# Patient Record
Sex: Female | Born: 1965 | Race: White | Hispanic: No | Marital: Single | Smoking: Former smoker
Health system: Southern US, Community
[De-identification: ages and names within clinical notes are randomized; demographics above are authoritative.]

## PROBLEM LIST (undated history)

## (undated) DIAGNOSIS — G894 Chronic pain syndrome: Secondary | ICD-10-CM

## (undated) DIAGNOSIS — M810 Age-related osteoporosis without current pathological fracture: Secondary | ICD-10-CM

## (undated) DIAGNOSIS — S069XAA Unspecified intracranial injury with loss of consciousness status unknown, initial encounter: Secondary | ICD-10-CM

## (undated) DIAGNOSIS — S065X9A Traumatic subdural hemorrhage with loss of consciousness of unspecified duration, initial encounter: Secondary | ICD-10-CM

## (undated) DIAGNOSIS — R519 Headache, unspecified: Secondary | ICD-10-CM

## (undated) DIAGNOSIS — H269 Unspecified cataract: Secondary | ICD-10-CM

## (undated) DIAGNOSIS — M1990 Unspecified osteoarthritis, unspecified site: Secondary | ICD-10-CM

## (undated) DIAGNOSIS — S069X9A Unspecified intracranial injury with loss of consciousness of unspecified duration, initial encounter: Secondary | ICD-10-CM

## (undated) DIAGNOSIS — G43909 Migraine, unspecified, not intractable, without status migrainosus: Secondary | ICD-10-CM

## (undated) DIAGNOSIS — F419 Anxiety disorder, unspecified: Secondary | ICD-10-CM

## (undated) DIAGNOSIS — F32A Depression, unspecified: Secondary | ICD-10-CM

## (undated) DIAGNOSIS — J449 Chronic obstructive pulmonary disease, unspecified: Secondary | ICD-10-CM

## (undated) DIAGNOSIS — D649 Anemia, unspecified: Secondary | ICD-10-CM

## (undated) DIAGNOSIS — K219 Gastro-esophageal reflux disease without esophagitis: Secondary | ICD-10-CM

## (undated) DIAGNOSIS — R51 Headache: Secondary | ICD-10-CM

## (undated) DIAGNOSIS — I10 Essential (primary) hypertension: Secondary | ICD-10-CM

## (undated) DIAGNOSIS — J309 Allergic rhinitis, unspecified: Secondary | ICD-10-CM

## (undated) DIAGNOSIS — F329 Major depressive disorder, single episode, unspecified: Secondary | ICD-10-CM

## (undated) DIAGNOSIS — S065XAA Traumatic subdural hemorrhage with loss of consciousness status unknown, initial encounter: Secondary | ICD-10-CM

## (undated) HISTORY — DX: Migraine, unspecified, not intractable, without status migrainosus: G43.909

## (undated) HISTORY — DX: Unspecified cataract: H26.9

## (undated) HISTORY — DX: Allergic rhinitis, unspecified: J30.9

## (undated) HISTORY — PX: CATARACT EXTRACTION: SUR2

## (undated) HISTORY — DX: Age-related osteoporosis without current pathological fracture: M81.0

## (undated) HISTORY — DX: Traumatic subdural hemorrhage with loss of consciousness status unknown, initial encounter: S06.5XAA

## (undated) HISTORY — DX: Traumatic subdural hemorrhage with loss of consciousness of unspecified duration, initial encounter: S06.5X9A

## (undated) HISTORY — DX: Chronic pain syndrome: G89.4

## (undated) HISTORY — DX: Unspecified intracranial injury with loss of consciousness status unknown, initial encounter: S06.9XAA

## (undated) HISTORY — DX: Anemia, unspecified: D64.9

## (undated) HISTORY — PX: BREAST ENHANCEMENT SURGERY: SHX7

## (undated) HISTORY — DX: Unspecified intracranial injury with loss of consciousness of unspecified duration, initial encounter: S06.9X9A

## (undated) HISTORY — DX: Chronic obstructive pulmonary disease, unspecified: J44.9

## (undated) HISTORY — PX: BRAIN SURGERY: SHX531

---

## 2006-06-20 ENCOUNTER — Ambulatory Visit: Payer: Self-pay | Admitting: Psychiatry

## 2006-06-21 ENCOUNTER — Inpatient Hospital Stay (HOSPITAL_COMMUNITY): Admission: AD | Admit: 2006-06-21 | Discharge: 2006-06-26 | Payer: Self-pay | Admitting: Psychiatry

## 2006-06-26 ENCOUNTER — Other Ambulatory Visit (HOSPITAL_COMMUNITY): Admission: RE | Admit: 2006-06-26 | Discharge: 2006-09-24 | Payer: Self-pay | Admitting: Psychiatry

## 2006-07-22 ENCOUNTER — Inpatient Hospital Stay (HOSPITAL_COMMUNITY): Admission: EM | Admit: 2006-07-22 | Discharge: 2006-07-24 | Payer: Self-pay | Admitting: Psychiatry

## 2007-01-09 ENCOUNTER — Inpatient Hospital Stay (HOSPITAL_COMMUNITY): Admission: AD | Admit: 2007-01-09 | Discharge: 2007-01-13 | Payer: Self-pay | Admitting: *Deleted

## 2007-01-09 ENCOUNTER — Ambulatory Visit: Payer: Self-pay | Admitting: *Deleted

## 2007-04-03 ENCOUNTER — Ambulatory Visit: Payer: Self-pay | Admitting: Psychiatry

## 2007-04-03 ENCOUNTER — Inpatient Hospital Stay (HOSPITAL_COMMUNITY): Admission: AD | Admit: 2007-04-03 | Discharge: 2007-04-06 | Payer: Self-pay | Admitting: Psychiatry

## 2007-06-20 ENCOUNTER — Ambulatory Visit: Payer: Self-pay | Admitting: Psychiatry

## 2007-06-21 ENCOUNTER — Inpatient Hospital Stay (HOSPITAL_COMMUNITY): Admission: RE | Admit: 2007-06-21 | Discharge: 2007-06-23 | Payer: Self-pay | Admitting: Psychiatry

## 2007-08-08 ENCOUNTER — Inpatient Hospital Stay (HOSPITAL_COMMUNITY): Admission: RE | Admit: 2007-08-08 | Discharge: 2007-08-12 | Payer: Self-pay | Admitting: *Deleted

## 2007-08-08 ENCOUNTER — Ambulatory Visit: Payer: Self-pay | Admitting: *Deleted

## 2007-09-04 ENCOUNTER — Ambulatory Visit: Payer: Self-pay | Admitting: *Deleted

## 2007-09-04 ENCOUNTER — Inpatient Hospital Stay (HOSPITAL_COMMUNITY): Admission: EM | Admit: 2007-09-04 | Discharge: 2007-09-05 | Payer: Self-pay | Admitting: *Deleted

## 2008-05-26 ENCOUNTER — Ambulatory Visit: Payer: Self-pay | Admitting: Psychiatry

## 2008-05-26 ENCOUNTER — Inpatient Hospital Stay (HOSPITAL_COMMUNITY): Admission: RE | Admit: 2008-05-26 | Discharge: 2008-06-02 | Payer: Self-pay | Admitting: Psychiatry

## 2008-05-26 ENCOUNTER — Emergency Department (HOSPITAL_COMMUNITY): Admission: EM | Admit: 2008-05-26 | Discharge: 2008-05-26 | Payer: Self-pay | Admitting: Emergency Medicine

## 2010-08-14 LAB — CBC
HCT: 36.2 % (ref 36.0–46.0)
Platelets: 132 10*3/uL — ABNORMAL LOW (ref 150–400)
RDW: 13.5 % (ref 11.5–15.5)
WBC: 5.4 10*3/uL (ref 4.0–10.5)

## 2010-08-14 LAB — DIFFERENTIAL
Basophils Relative: 0 % (ref 0–1)
Eosinophils Relative: 2 % (ref 0–5)
Lymphocytes Relative: 21 % (ref 12–46)
Monocytes Absolute: 0.5 10*3/uL (ref 0.1–1.0)
Monocytes Relative: 10 % (ref 3–12)
Neutro Abs: 3.5 10*3/uL (ref 1.7–7.7)

## 2010-08-14 LAB — COMPREHENSIVE METABOLIC PANEL
AST: 40 U/L — ABNORMAL HIGH (ref 0–37)
Albumin: 3.6 g/dL (ref 3.5–5.2)
Alkaline Phosphatase: 115 U/L (ref 39–117)
BUN: 3 mg/dL — ABNORMAL LOW (ref 6–23)
Chloride: 105 mEq/L (ref 96–112)
GFR calc Af Amer: >60 mL/min (ref >60)
Potassium: 3.1 mEq/L — ABNORMAL LOW (ref 3.5–5.1)
Total Protein: 6.8 g/dL (ref 6.0–8.3)

## 2010-08-14 LAB — RAPID URINE DRUG SCREEN, HOSP PERFORMED
Amphetamines: NOT DETECTED
Benzodiazepines: POSITIVE — AB
Cocaine: NOT DETECTED
Tetrahydrocannabinol: NOT DETECTED

## 2010-08-14 LAB — URINALYSIS, ROUTINE W REFLEX MICROSCOPIC
Bilirubin Urine: NEGATIVE
Ketones, ur: 15 mg/dL — AB
Nitrite: NEGATIVE
Protein, ur: NEGATIVE mg/dL
Urobilinogen, UA: 1 mg/dL (ref 0.0–1.0)
pH: 7.5 (ref 5.0–8.0)

## 2010-08-14 LAB — ETHANOL: Alcohol, Ethyl (B): <5 mg/dL (ref 0–10)

## 2010-08-14 LAB — URINE MICROSCOPIC-ADD ON
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
## 2010-09-12 NOTE — H&P (Signed)
[name], [name]             ACCOUNT NO.:  [id number]   MEDICAL RECORD NO.:  [id number]          PATIENT TYPE:  IPS   LOCATION:  0305                          FACILITY:  [facility]   PHYSICIAN:  Jasmine Pang, M.D. DATE OF BIRTH:  01/06/1966   DATE OF ADMISSION:  01/09/2007  DATE OF DISCHARGE:                       PSYCHIATRIC ADMISSION ASSESSMENT   IDENTIFYING INFORMATION:  This is a 45 year old white female.  This is a  voluntary admission.   HISTORY OF PRESENT ILLNESS:  Patient presented in the emergency room at  [facility] with an alcohol level of 0.41.  This was her third visit  to the emergency room within the week for alcohol intoxication.  She is  currently followed at [facility] and had not been taking  her medications at home.  She, herself, this time had called emergency  services because she had fallen down the steps and knew that she needed  help.  Requesting detox from alcohol.  Reports that she relapsed about  two months ago on alcohol that began with drinking one beer now and then  throughout the week and, at the same time, began taking some Librium  that she had in a previous prescription for a detox protocol.  Drinking  escalated rapidly and she is unable to tell us exactly how much she has  been drinking for the past several days.  She has some thought blocking,  psychomotor slowing, some derailing today of thought and somewhat labile  affect, becoming frequently tearful and she is a poor historian due to  impaired cognition.  She denies any suicidal thoughts today.  Denies any  homicidal thought.  Was involuntarily petitioned due to her level of  intoxication.   PAST PSYCHIATRIC HISTORY:  Third admission to [facility] this year.  First admission was from of June 21, 2006  to June 26, 2006 for major depressive disorder and detox from  alcohol and most recent admission July 22, 2006 to July 24, 2006, also  for alcohol abuse and dependence.  Currently followed as an outpatient  by [facility].  No history of prior suicide attempts.  Longest history of sobriety being 130 days.  She had been referred to a  28-day program in the past but did not stay.  Has been treated in the  past with Risperdal, Lexapro and Campral but had not taken anything  regularly.   SOCIAL HISTORY:  Widowed four years ago.  Has current relationship for  the past 1-1/2 years with a gentleman who also uses alcohol.  The  patient reports unplanned pregnancy recently has been an additional  stressor and she had a therapeutic abortion approximately two weeks ago.  She works full-time for [employer] out of her home.  Has one son in  college at [employer] at [employer] on full  scholarship.  She denies any current legal problems.   MEDICAL HISTORY:  Primary care [name] is unclear.  Medical problems  have been elevated blood pressure for which she has taken lisinopril,  anywhere between 10 mg and 20  mg, currently on 10 mg daily.   ALLERGIES:  PENICILLIN and SULFA.   POSITIVE PHYSICAL FINDINGS:  Full physical exam was done in the  emergency room.  This is a compact-built female with flushed face and  psychomotor slowing and blunted affect.  Height 5 feet 2 inches tall,  weight 105 pounds, temperature 98.6, pulse 99, respirations 18, blood  pressure 151/99.  See full physical exam done in the emergency room at  [facility].   LABORATORY DATA:  CBC revealed WBC 5.0, hemoglobin 12.5, hematocrit  35.7, platelets 146,000 and MCV 97.  Chemistry revealed sodium 132,  potassium 4.1, chloride 95, carbon dioxide 21, BUN 4, creatinine 0.43  and random glucose is 98.  Liver enzymes revealed SGOT 61, SGPT 30,  alkaline phosphatase 74 and total bilirubin of 0.4.  Her urine drug  screen was positive for benzodiazepines and routine urinalysis  remarkable for trace glucose.   MENTAL STATUS  EXAM:  Fully alert female with a dazed affect.  Psychomotor slowing.  Responses are lag.  Has a perplexed look on her  face.  Attempting to cooperate with history.  History is forthcoming.  Some thought blocking and derailing.  No asterixis or frank tremor.  Mood is depressed and irritable.  She is primarily concerned at this  point about keeping her job and is worried that her company will  terminate her for missing work.  Having difficulty remembering when she  worked last and what her benefit situation is at this point.  She is  denying suicidal or homicidal thoughts today.  Denies depression, having  difficulty recalling medication dosages and when she last took them.  Unable to remember exactly how much she has been drinking.  Cognition is  impaired for recent and immediate memory.  Losing her train of thought  mid-sentence.  Judgment is impaired.  Calculation and concentration are  impaired.  Insight is impaired.  She is upset about the fact that she  has been involuntarily petitioned and has asked for an explanation of  this about three different times during the interview.  However, she has  been cooperative and directable.   DIAGNOSES:  AXIS I:  Alcohol abuse and dependence; rule out substance-  induced mood disorder.  AXIS II:  Deferred.  AXIS III:  Elevated blood pressure, rule out hypertension.  AXIS IV:  Severe (issues with social functioning and missing work).  AXIS V:  Current GAF 30; past year 70+.   PLAN:  To involuntarily admit the patient with a goal of a safe detox.  We have started her on a Librium protocol and gave her 100 mg loading  dose last night.  Will continue day #1 on the protocol today, force  fluids and monitor her vital signs closely.  We will also give her  trazodone 50 mg h.s. p.r.n. insomnia and will continue her lisinopril at  10 mg daily.  She is currently attending dual diagnosis programming.   ESTIMATED LENGTH OF STAY:  Five days.       Margaret A. Lorin Picket, N.P.      Jasmine Pang, M.D.  Electronically Signed    MAS/MEDQ  D:  01/10/2007  T:  01/10/2007  Job:  [phone]
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
## 2010-09-15 NOTE — Discharge Summary (Signed)
NAMEROSANN, GORUM             ACCOUNT NO.:  [id number]   MEDICAL RECORD NO.:  [id number]          PATIENT TYPE:  IPS   LOCATION:  0503                          FACILITY:  [facility]   PHYSICIAN:  Anselm Jungling, MD  DATE OF BIRTH:  [birth year]   DATE OF ADMISSION:  04/03/2007  DATE OF DISCHARGE:  04/06/2007                               DISCHARGE SUMMARY   IDENTIFYING DATA AND REASON FOR ADMISSION:  This was an inpatient  psychiatric admission for Cierra, a 45 year old married white female  admitted due to alcohol relapse, depression, and suicidal ideation,  within the context of severe marital discord.  Please refer to the  admission note for further details pertaining to the symptoms,  circumstances and history that led to her hospitalization.  She was  given an initial Axis I diagnoses of alcohol dependence, and alcohol  withdrawal.   MEDICAL AND LABORATORY:  The patient was admitted to the inpatient  psychiatric service, and was assessed by the psychiatric nurse  practitioner.  Presenting medical problems included hypertension, and  current medications included naltrexone and lisinopril.  There were no  acute medical issues during this brief inpatient stay.   HOSPITAL COURSE:  The patient was admitted to the adult inpatient  service.  She presented as a well-nourished, well-developed woman who  was alert, fully oriented, requesting discharge.  There were no signs or  symptoms of psychosis or thought disorder.   Historical information we received indicated that the patient had a long  history of multiple hospitalizations and severe dysfunction due to her  chronic alcohol dependence, and inability to remain abstinent.   The patient was placed on a Librium withdrawal protocol and given  tapering doses of Librium over 3 days.  This ameliorated expression of  alcohol withdrawal symptoms that she would have had otherwise.  Throughout this process, she indicated her opinion that  she did not feel  it was necessary and expressed concern that being away from work would  affect her employment.   On the fourth hospital day, the patient appeared to have completed the  alcohol withdrawal process, and no longer appeared to be at risk for  medical sequelae of alcohol withdrawal.  According to her wishes, she  was discharged.  She agreed to the following aftercare plan.   AFTERCARE:  The patient was to follow-up at [facility] on  April 07, 2007.  She was also [facility]  with the intake appointment there on April 09, 2007.   DISCHARGE MEDICATIONS:  Prinivil 10 mg daily.   DISCHARGE DIAGNOSES:  AXIS I: Alcohol dependence, early remission.  AXIS II: Deferred.  AXIS III: No acute or chronic illnesses  AXIS IV: Stressors severe.  AXIS V: GAF on discharge 50.      Anselm Jungling, MD  Electronically Signed     SPB/MEDQ  D:  04/17/2007  T:  04/18/2007  Job:  [id number]
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
## 2010-09-15 NOTE — Discharge Summary (Signed)
NAMECELISA, SCHOENBERG             ACCOUNT NO.:  [id number]   MEDICAL RECORD NO.:  [id number]          PATIENT TYPE:  IPS   LOCATION:  0305                          FACILITY:  BH   PHYSICIAN:  Jasmine Pang, M.D. DATE OF BIRTH:  March 23, 1966   DATE OF ADMISSION:  01/09/2007  DATE OF DISCHARGE:  01/13/2007                               DISCHARGE SUMMARY   IDENTIFICATION:  This is a 45 year old white female who was admitted on  a voluntary basis.   HISTORY OF PRESENT ILLNESS:  The patient presented in the emergency room  at [facility] with an alcohol level of 0.41.  This was the third  visit to the emergency room within the week of alcohol intoxication.  She is currently followed at [facility] and has not  been taking her medications at home.  She herself at this time had  called emergency services because she had fallen down the steps and knew  she needed help.  She was requesting detox from alcohol.  She reports  that she relapsed about 2 months ago on alcohol and began drinking 1  beer now and then through the week.  At the same time, she began taking  Librium, which she had been given on a previous prescription for detox  protocol.  Her drinking escalated rapidly and she was unable to tell us  exactly how much she has been drinking for the past several days.  She  has some thought blocking, psychomotor slowing, and some impaired  cognition.  She denies any suicidal thoughts.  On the day of admission,  she denied any homicidal thoughts.  She was involuntarily petitioned due  to her level of intoxication.  This was the third admission to [facility] this year.  First admission was from  June 21, 2006, to June 26, 2006, for major depressive disorder  and detox from alcohol.  Most recent admission was July 22, 2006, to  July 24, 2006, for alcohol abuse and dependence.  She is currently  followed as an outpatient by [facility].  She has no  history of prior suicide attempts.  The longest history of sobriety  being 130 days.  She had been referred to a 28-day program in the past,  but did not stay.  She has been treated with Risperdal, Lexapro, and  Campral, but has not taken anything regularly.  The patient has been  widowed for 4 years.  Her current relationship for the past 1-1/2 years  is with a gentleman who also uses alcohol.  She reported an unplanned  pregnancy recently, which has been an additional stressor.  She had a  therapeutic abortion approximately 2 weeks prior to admission.  She  works full time for the [employer] out of her home.  She has 1 son  in college at [employer] [address], [place], on a full  scholarship.  She denies any current legal problems.  Her medical  problems have been hypertension for which she takes lisinopril anywhere  between 10 mg and 20 mg  daily, currently on 10 mg daily.  She is  allergic to PENICILLIN and SULFA drugs.   PHYSICAL FINDINGS:  Full physical exam was done in the emergency room.  This is a compact-built female with flushed face and psychomotor slowing  and blunted affect.  She had no acute physical or medical problems.   ADMISSION LABORATORY DATA:  CBC revealed a WBC of 5, hemoglobin of 12.5,  hematocrit of 35.7, platelets of 146,000, and MCV of 97.  Chemistry  revealed sodium of 132, potassium 4.1, chloride 95, carbon dioxide 21,  BUN 4, creatinine 43, and random glucose is 98.  Liver enzymes revealed  an SGOT is 61, SGPT of 30, alkaline phosphatase of 74, and total  bilirubin was 0.4.  Her urine drug screen was positive for  benzodiazepines and routine urinalysis remarkable for trace glucose.   HOSPITAL COURSE:  Upon admission, the patient was continued on her home  birth control pill 1 daily.  She was also started on Librium detox  protocol.  She was continued on her lisinopril 10 mg p.o. daily.  On  January 10, 2007, she was started on trazodone 50 mg p.o. q.h.s.  p.r.n. insomnia.  On January 11, 2007, the Librium protocol was  discontinued, instead she was placed on Ativan detox protocol (1 mg  t.i.d. on January 11, 2007, 1 mg b.i.d. on January 12, 2007, 1 mg  daily on January 13, 2007, and then discontinue).  On January 13, 2007, she was placed on nicotine 21 mg patch as per smoking cessation  protocol.  On January 13, 2007, she was placed on Vivitrol 380 mg IM  and the patient tolerated these medications well with no significant  side effects.  The patient was friendly and cooperative.  She was able  to participate appropriately in unit therapeutic groups and activities.  She stated that in addition to her son in college, she also has a 25-  year-old son who currently lives with a friend.  She stated she has  conflict with and lack of support from her second husband.  First  husband died 4 years ago.  For the past few days, she began to drink a  lot.  She stated she began to have DTs when she did not drink.  She  had also been depressed and alcohol was helpful with that.  As  hospitalization progressed, mental status improved.  On January 13, 2007, the patient's mood was euthymic.  Affect wide range.  There was no  suicidal or homicidal ideation.  No thoughts of self-injurious behavior.  No auditory or visual hallucinations.  No paranoia or delusions.  Thoughts were logical and goal directed.  Thought content no predominant  theme.  Cognitive was grossly back to baseline.  The patient was  considering fellowship home for followup.  She liked to be on the  Vivitrol because it was a once monthly injection.  The Campral dose that  she had been on was too complicated to remain compliant with.  She was  also talking about ADS as a possibility.  It was felt she was safe to be  discharged home.   DISCHARGE DIAGNOSES:  AXIS I:  Alcohol dependence.  Depressive disorder,  not otherwise  specified.  AXIS II:  None.  AXIS III:  Elevated blood pressure, rule out hypertension.  AXIS IV:  Severe (issues with social functioning and missing work).  AXIS V:  Global assessment of functioning upon discharge was 50.  Global  assessment of functioning upon admission was 30.  Global assessment of  functioning highest past year was 70 plus.   DISCHARGE PLANS:  There were no specific activity level or dietary  restrictions.   POSTHOSPITAL CARE PLANS:  The patient will go to alcohol and drug  services for followup.  She will see Cherlyn Cushing there.  She will also  start therapy with Harlon Ditty at the [facility]  on January 14, 2007.   DISCHARGE MEDICATIONS:  1. Lisinopril 10 mg daily.  2. ReVia 50 mg daily.  3. Vivitrol 380 mg IM given on January 13, 2007.  4. Trazodone 50 mg at bedtime if needed for sleep.      Jasmine Pang, M.D.  Electronically Signed     BHS/MEDQ  D:  02/04/2007  T:  02/04/2007  Job:  [id number]
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
## 2010-09-15 NOTE — H&P (Signed)
[name], [name]             ACCOUNT NO.:  [id number]   MEDICAL RECORD NO.:  [id number]          PATIENT TYPE:  IPS   LOCATION:  0503                          FACILITY:  BH   PHYSICIAN:  Anselm Jungling, MD  DATE OF BIRTH:  [birth year]   DATE OF ADMISSION:  04/03/2007  DATE OF DISCHARGE:  04/06/2007                       PSYCHIATRIC ADMISSION ASSESSMENT   IDENTIFICATION:  45 year old separated white female.  This is an  involuntary admission.   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  Alcohol level 380 on  presentation and [name] had come accompanied by a friend who brought her  because [name] had told the friend that [name] wanted to die.  [name] reports  that [name] has been binge drinking for about 3 weeks after her parents  told her that [name] could not come to the Thanksgiving Day family dinner  and see her son in spite of the fact that [name] had achieved 70 days of  abstinence after discharge from the [facility] treatment center.  [name] is  denying any suicidal thoughts today and denies that [name] ever made such a  statement and no homicidal thought.  [name] is primarily concerned today  about getting back to work and is concerned that [name] may have lost her  job after feeling to appear for work on Thursday December 4.   PAST PSYCHIATRIC HISTORY:  The [facility] [place], [place], and also by Sena Hitch at the [employer].  This is one of several  admissions to [facility], last admission  September 11 to the 15th 2008 and previous admissions March 24-26,  2008, and on February 27 for 2 days in 2008, and  February 22-28, 2008.  Recently [name] was at the [facility] treatment center and had 70 days of  abstinence.  Previously on Vivitrol injection prior to her last  discharge and has been taking naltrexone orally as her only medication.   SOCIAL HISTORY:  The patient is currently living alone, separated from  her husband, has a 10 year old son in college and a 40 year old son  living with grandparents.  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  Has  had some issues with a job being at risk due to frequent absences, but  at this point is currently employed and denying any legal problems.   MEDICAL HISTORY:  Current medical problems are hypertension and current  medications are naltrexone 50 mg daily and lisinopril 10 mg daily.  Also  [name] routinely takes an oral contraceptive tablet which [name] has brought  with her and multivitamin and a B1 tablet daily.  Please note the  patient was prescribed Ativan 0.5 mg in a tapering fashion by the  emergency room  and has also been treated with phenobarbital in the last  couple of days.   DRUG ALLERGIES:  PENICILLIN, BACTRIM.   POSITIVE PHYSICAL FINDINGS:  A 45 year old petite white female with  flushed face, slight tremor.  Skin is moist and [name] appears to be in  full detox. Her full physical exam was done in the emergency room at  [facility] and is noted in the record.  [name] is 5 feet 1 inch  tall, 114 pounds.  Temperature 100.3.  Medical history is remarkable for  alcohol abuse and dependence.  Pulse 131, respirations 18, blood  pressure 147/81 and O2 saturation 96%.  A full physical exam done in the  [facility] emergency room and please note that there are additional  notations from the emergency room from her previous visits over the  preceding 2 weeks.   DIAGNOSTIC STUDIES:  Her phenobarbital level was 17.1 which was checked  due to having received previous doses  of phenobarbital, and urine drug  screen positive for barbiturates.  Ethanol level was 0.38 and chemistry,  sodium 132, potassium 4.6, chloride 97, carbon dioxide 23, BUN 6,  creatinine 0.49 and random glucose 91.  Liver enzymes SGOT 64, SGPT 18,  alkaline phosphatase 253 and total bilirubin 0.3.  Routine urinalysis  was unremarkable and CBC WBC 4.3, hemoglobin 13.4, hematocrit 38.5,  platelets 182,000 and MCV 99.  Acetaminophen less than 10.  Salicylate  level less than 1   MENTAL STATUS EXAM:  Fully alert female, anxious affect, flushed,  appears to be in full detox and CIWA estimated about 12, having fine  constant tremor.  Speech is normal in production pace, tone but is  slightly tremulous voice tone.  Mood is quite anxious.  Denying any  suicidal or homicidal thought, logical, coherent, goal directed  thinking, wanting to leave, mostly expressing a lot of concern about her  job.  Cognition is intact to person, place and situation.  Immediate,  recent, remote memory are intact.  Judgment questionable.  Impulse  control guarded.   AXIS I:  Rule out substance-induced mood disorder.  Alcohol abuse and  dependence.  AXIS II:  Deferred  AXIS III:  Hypertension by history.  AXIS IV:  Severe, issues with social support system and problems with  social environment and functioning  AXIS V:  Current 39 past year 14 estimated.   PLAN:  Is to involuntarily admit the patient.  We have placed her on a  Librium detox in our dual diagnosis program and [name] is tolerating it  well without difficulty.  We have explained how to access additional  Librium for her symptoms and we are checking a CIWA score on her for  withdrawal every 6 hours.  We are going to restart her naltrexone 25 mg  daily for the first week then 50 mg daily and have restarted her  lisinopril 10 mg daily.   Estimated length of stay is 3 days      Margaret A. Lorin Picket, N.P.      Anselm Jungling, MD  Electronically  Signed    MAS/MEDQ  D:  04/07/2007  T:  04/07/2007  Job:  [phone]

## 2010-09-15 NOTE — Discharge Summary (Signed)
[name], [name]             ACCOUNT NO.:  [id number]   MEDICAL RECORD NO.:  [id number]          PATIENT TYPE:  IPS   LOCATION:  0301                          FACILITY:  BH   PHYSICIAN:  Anselm Jungling, MD  DATE OF BIRTH:  [birth year]   DATE OF ADMISSION:  06/21/2006  DATE OF DISCHARGE:  06/26/2006                               DISCHARGE SUMMARY   IDENTIFYING DATA AND REASON FOR ADMISSION:  The patient is a 45 year old  separated female with a history of alcoholism who relapsed on alcohol 2  months prior to admission.  On the evening of June 19, 2006, she had  an alcoholic binge, and following this, a friend took her to get  evaluation.  While intoxicated, she made suicidal statements which she  subsequently denied.  She had been seeing a gentleman named [name]  for chemical dependency counseling, both individual and group.  Please  refer to the admission note for further details pertaining to the  symptoms, circumstances and history that led to her hospitalization.  She was given an initial Axis I diagnosis of alcohol dependence.   MEDICAL AND LABORATORY:  The patient came to [place] with a history of  hypertension, but otherwise was in good health.  She was medically and  physically assessed by the psychiatric nurse practitioner.  She was  continued on lisinopril 10 mg daily.   HOSPITAL COURSE:  The patient was admitted to the adult inpatient  psychiatric service.  She presented as a well-nourished, well-developed  woman who was alert, and fully oriented, but tired and apparently hung  over.  She was well-groomed and well-organized.  There were no signs or  symptoms of psychosis or delirium, and she was nontremulous.  She  convincingly denied any suicidal ideation.  She indicated that she  wanted at that time to discharge to outpatient treatment.   Although it was felt that the patient was not presenting any significant  suicide risk, we felt it possible that the  patient might have been  drinking more heavily than she had admitted and, therefore, might have  been a risk for significant alcohol withdrawal symptoms.  On close  examination, she did appear to have some tremulousness.  She was placed  on Librium 10 mg t.i.d.   The patient continued her outpatient treatment, and emerged over the  next 2 days appeared to be symptoms of alcohol withdrawal, in  combination with an upper respiratory infection that was most likely  viral, possibly influenza.   On the fifth hospital day, the patient presented as tearful, anxious,  and tremulous.  She still wanted discharge.  I told her that she still  appeared to be in alcohol withdrawal and did not appear to be  appropriate for discharge.  Because her parents were quite involved in  her day-to-day life, a family meeting with them was suggested.   That meeting occurred later the same day.  In that meeting, the patient  again stated that she was not suicidal.  Her parents commented that the  patient had been drinking heavily for a long time and that  this had led  to many negative consequences.  Parents described a very strong support  system for the patient.  They discussed aftercare needs.   The following day the patient reported that this family meeting had been  a very positive interview.  She felt good about their support.  She  appeared much more relaxed, composed, and displayed pleasant affect.   She had previously been utilizing Ativan to address anxiety symptoms.  Because of her chemical dependency it was felt that Risperdal in low  doses would be a desirable alternative.  The patient also agreed to a  trial of Campral.  Risperdal was well tolerated and continued at the  time of discharge.   The patient was discharged on the seventh hospital day and agreed to the  following aftercare plan.   AFTERCARE:  The patient was to be assessed for the intensive outpatient  chemical dependency program at  the [facility] on the day  of discharge, at 10:30 a.m.  Presumably, she would attend the next  scheduled session of the program.   DISCHARGE MEDICATIONS:  Zoloft 50 mg daily, lisinopril 10 mg daily,  Campral 666 mg t.i.d., Risperdal 0.25 mg q.i.d.   The patient was instructed to follow-up with her primary care [name]  for medical issues.   DISCHARGE DIAGNOSES:  AXIS I:  1. Alcohol dependence.  2. Depressive disorder not otherwise specified.  AXIS II:  Deferred.  AXIS III:  History of hypertension.  AXIS IV: Stressors severe.  AXIS V: Global assessment of functioning on discharge 55.      Anselm Jungling, MD  Electronically Signed     SPB/MEDQ  D:  07/31/2006  T:  07/31/2006  Job:  [phone]
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
## 2010-09-15 NOTE — H&P (Signed)
[name], [name]             ACCOUNT NO.:  [id number]   MEDICAL RECORD NO.:  [id number]          PATIENT TYPE:  IPS   LOCATION:  0501                          FACILITY:  [facility]   PHYSICIAN:  Geoffery Lyons, M.D.      DATE OF BIRTH:  11/24/1965   DATE OF ADMISSION:  07/22/2006  DATE OF DISCHARGE:                       PSYCHIATRIC ADMISSION ASSESSMENT   A 45 year old separated white female voluntarily admitted on July 22, 2006.   HISTORY OF PRESENT ILLNESS:  The patient presents with a history of  alcohol abuse, relapsed about 2 weeks ago, has been drinking beer,  drinking 8 to 10 over a 24 hours period.  She also reports drinking  during the middle of the night.  She feels depressed, but having no  suicidal thoughts.  Reports a decreased appetite, but her weight has  been stable the patient states she wants to get a children back.  They  are currently in DSS.  She has been noncompliant with her medication due  to financial reasons.  She intends to go to [facility] for CD IOP  program.   PAST PSYCHIATRIC HISTORY:  Second admission to [facility].  Was here in February of 2008.  Is currently sponsored by [place].  The  patient has had no prior suicide attempts.  Her longest history of  sobriety has been 130 days.  She was at [facility] in a 28 day  program, but states she did not stay.   SOCIAL HISTORY:  She is a 45 year old married female, has two children  ages 90 and 58.  Twelfth grade education.  Has been unemployed since  February.  She was working in [employer].  Her children are  currently in DSS since February.   FAMILY HISTORY:  None.   ALCOHOL AND DRUG HISTORY:  The patient smokes.  Alcohol habits as  described above.  No drug use.   PRIMARY CARE [place]:  Dr. Maisie Fus in [place].   MEDICAL PROBLEMS:  Hypertension.   MEDICATIONS:  Lisinopril 20 mg daily, Klonopin, birth control pill, was  on Risperdal, Lexapro and Campral, but is  noncompliant with her  psychotropic medications.   DRUG ALLERGIES:  PENICILLIN AND SULFA where she reports a history of  hives.   PHYSICAL EXAMINATION:  The patient was fully assessed at [facility] where she received Librium and Ativan.  Her temperature is  98.4, 104 heart rate, 20 respirations, blood pressure 138/74, 98 pounds,  5 feet 1 inch tall.  BMI is 18.2.  The patient appears to be having some  very mild upper extremity tremors.   LABORATORY DATA:  CBC within normal limits.  Drug screen is negative.  Alcohol level of 20.  Urinalysis is negative.  AST is mildly elevated at  59 with a reference range of 14-36.   MENTAL STATUS EXAM:  She is fully alert, cooperative, good eye contact.  She is casually dressed.  Speech is clear, normal pace and tone.  The  patient feels depressed and anxious.  The patient does become teary-eyed  at times especially  when talking about her children.  Thought processes  are coherent and goal directed.  There is no evidence of any suicidal or  homicidal thoughts.  Cognitive function intact.  Judgment good.  Insight  good.  Her recent and remote memory is intact.   AXIS I:  1. Alcohol abuse, rule out dependence.  2. Depressive disorder, not otherwise specified.  AXIS II:  Deferred.  AXIS III:  Hypertension.  AXIS IV:  Severe related to primary support group, occupation, possible  housing problems, economic issues, problems related to legal system  related to DSS and other psychosocial problems.  AXIS V:  Current is 40/45.   PLAN:  Voluntary admission to be detoxed safely with the Librium  protocol.  Will work on relapse prevention.  Monitor withdrawal  symptoms.  Will reconsider placing the patient back on her Lexapro and  Campral.  Will consider family session with her husband.  Case manager  is to obtain follow-up appointments with rehab and psychiatry.  Medication compliance to be reinforced.  Estimated length of stay is 3   days.      Landry Corporal, N.P.      Geoffery Lyons, M.D.  Electronically Signed    JO/MEDQ  D:  07/23/2006  T:  07/23/2006  Job:  [id number]

## 2012-07-25 DIAGNOSIS — G894 Chronic pain syndrome: Secondary | ICD-10-CM | POA: Insufficient documentation

## 2012-07-25 DIAGNOSIS — M25552 Pain in left hip: Secondary | ICD-10-CM | POA: Insufficient documentation

## 2012-07-25 DIAGNOSIS — M47816 Spondylosis without myelopathy or radiculopathy, lumbar region: Secondary | ICD-10-CM | POA: Insufficient documentation

## 2012-07-25 DIAGNOSIS — M51369 Other intervertebral disc degeneration, lumbar region without mention of lumbar back pain or lower extremity pain: Secondary | ICD-10-CM | POA: Insufficient documentation

## 2012-07-25 DIAGNOSIS — Z79891 Long term (current) use of opiate analgesic: Secondary | ICD-10-CM | POA: Insufficient documentation

## 2012-07-25 DIAGNOSIS — M5416 Radiculopathy, lumbar region: Secondary | ICD-10-CM | POA: Insufficient documentation

## 2013-02-17 DIAGNOSIS — S069XAA Unspecified intracranial injury with loss of consciousness status unknown, initial encounter: Secondary | ICD-10-CM | POA: Insufficient documentation

## 2013-03-20 DIAGNOSIS — G43719 Chronic migraine without aura, intractable, without status migrainosus: Secondary | ICD-10-CM | POA: Insufficient documentation

## 2013-03-20 DIAGNOSIS — F1020 Alcohol dependence, uncomplicated: Secondary | ICD-10-CM | POA: Insufficient documentation

## 2013-03-20 DIAGNOSIS — G5603 Carpal tunnel syndrome, bilateral upper limbs: Secondary | ICD-10-CM | POA: Insufficient documentation

## 2016-09-02 ENCOUNTER — Emergency Department: Admission: EM | Admit: 2016-09-02 | Discharge: 2016-09-02 | Discharge status: Absent without leave | Payer: Self-pay

## 2017-05-16 ENCOUNTER — Other Ambulatory Visit: Payer: Self-pay

## 2017-05-16 ENCOUNTER — Telehealth: Payer: Self-pay

## 2017-05-16 DIAGNOSIS — K839 Disease of biliary tract, unspecified: Secondary | ICD-10-CM

## 2017-05-16 NOTE — Telephone Encounter (Signed)
EUS 06/06/17 1145 am [facility] Left message on machine to call back

## 2017-05-16 NOTE — Telephone Encounter (Signed)
EUS scheduled, pt instructed and medications reviewed.  Patient instructions mailed to home.  Patient to call with any questions or concerns.  

## 2017-05-24 ENCOUNTER — Encounter (HOSPITAL_COMMUNITY): Payer: Self-pay

## 2017-05-24 ENCOUNTER — Other Ambulatory Visit: Payer: Self-pay

## 2017-06-06 ENCOUNTER — Encounter (HOSPITAL_COMMUNITY)
Admission: RE | Disposition: A | Discharge status: Home / Self Care | Payer: Self-pay | Source: Ambulatory Visit | Attending: Gastroenterology

## 2017-06-06 ENCOUNTER — Ambulatory Visit (HOSPITAL_COMMUNITY): Payer: Medicare Other | Admitting: Anesthesiology

## 2017-06-06 ENCOUNTER — Encounter (HOSPITAL_COMMUNITY): Payer: Self-pay

## 2017-06-06 ENCOUNTER — Ambulatory Visit (HOSPITAL_COMMUNITY)
Admission: RE | Admit: 2017-06-06 | Discharge: 2017-06-06 | Disposition: A | Discharge status: Home / Self Care | Payer: Medicare Other | Source: Ambulatory Visit | Attending: Gastroenterology | Admitting: Gastroenterology

## 2017-06-06 ENCOUNTER — Other Ambulatory Visit: Payer: Self-pay

## 2017-06-06 DIAGNOSIS — K838 Other specified diseases of biliary tract: Secondary | ICD-10-CM | POA: Insufficient documentation

## 2017-06-06 DIAGNOSIS — F419 Anxiety disorder, unspecified: Secondary | ICD-10-CM | POA: Diagnosis not present

## 2017-06-06 DIAGNOSIS — R634 Abnormal weight loss: Secondary | ICD-10-CM | POA: Diagnosis not present

## 2017-06-06 DIAGNOSIS — Z8679 Personal history of other diseases of the circulatory system: Secondary | ICD-10-CM | POA: Insufficient documentation

## 2017-06-06 DIAGNOSIS — F329 Major depressive disorder, single episode, unspecified: Secondary | ICD-10-CM | POA: Insufficient documentation

## 2017-06-06 DIAGNOSIS — M1990 Unspecified osteoarthritis, unspecified site: Secondary | ICD-10-CM | POA: Insufficient documentation

## 2017-06-06 DIAGNOSIS — K2970 Gastritis, unspecified, without bleeding: Secondary | ICD-10-CM

## 2017-06-06 DIAGNOSIS — Z79899 Other long term (current) drug therapy: Secondary | ICD-10-CM | POA: Insufficient documentation

## 2017-06-06 DIAGNOSIS — I10 Essential (primary) hypertension: Secondary | ICD-10-CM | POA: Diagnosis not present

## 2017-06-06 DIAGNOSIS — F17210 Nicotine dependence, cigarettes, uncomplicated: Secondary | ICD-10-CM | POA: Diagnosis not present

## 2017-06-06 DIAGNOSIS — K219 Gastro-esophageal reflux disease without esophagitis: Secondary | ICD-10-CM | POA: Diagnosis not present

## 2017-06-06 DIAGNOSIS — K839 Disease of biliary tract, unspecified: Secondary | ICD-10-CM

## 2017-06-06 HISTORY — DX: Headache, unspecified: R51.9

## 2017-06-06 HISTORY — DX: Gastro-esophageal reflux disease without esophagitis: K21.9

## 2017-06-06 HISTORY — DX: Depression, unspecified: F32.A

## 2017-06-06 HISTORY — DX: Unspecified osteoarthritis, unspecified site: M19.90

## 2017-06-06 HISTORY — DX: Major depressive disorder, single episode, unspecified: F32.9

## 2017-06-06 HISTORY — DX: Anxiety disorder, unspecified: F41.9

## 2017-06-06 HISTORY — PX: EUS: SHX5427

## 2017-06-06 HISTORY — DX: Essential (primary) hypertension: I10

## 2017-06-06 HISTORY — DX: Headache: R51

## 2017-06-06 SURGERY — UPPER ENDOSCOPIC ULTRASOUND (EUS) RADIAL
Anesthesia: Monitor Anesthesia Care

## 2017-06-06 MED ORDER — PROPOFOL 10 MG/ML IV BOLUS
INTRAVENOUS | Status: DC | PRN
  Administered 2017-06-06: 40 mg via INTRAVENOUS
  Administered 2017-06-06 (×2): 20 mg via INTRAVENOUS

## 2017-06-06 MED ORDER — PROPOFOL 500 MG/50ML IV EMUL
INTRAVENOUS | Status: DC | PRN
  Administered 2017-06-06: 50 ug/kg/min via INTRAVENOUS

## 2017-06-06 MED ORDER — LACTATED RINGERS IV SOLN
INTRAVENOUS | Status: DC
  Administered 2017-06-06: 12:00:00 via INTRAVENOUS

## 2017-06-06 MED ORDER — PROPOFOL 10 MG/ML IV BOLUS
INTRAVENOUS | Status: AC
  Filled 2017-06-06: qty 40

## 2017-06-06 MED ORDER — ONDANSETRON HCL 4 MG/2ML IJ SOLN
INTRAMUSCULAR | Status: DC | PRN
  Administered 2017-06-06: 4 mg via INTRAVENOUS

## 2017-06-06 MED ORDER — SODIUM CHLORIDE 0.9 % IV SOLN: INTRAVENOUS | Status: DC

## 2017-06-06 NOTE — Discharge Instructions (Signed)
YOU HAD AN ENDOSCOPIC PROCEDURE TODAY: Refer to the procedure report and other information in the discharge instructions given to you for any specific questions about what was found during the examination. If this information does not answer your questions, please call [place] office at [phone] to clarify.  ° °YOU SHOULD EXPECT: Some feelings of bloating in the abdomen. Passage of more gas than usual. Walking can help get rid of the air that was put into your GI tract during the procedure and reduce the bloating. If you had a lower endoscopy (such as a colonoscopy or flexible sigmoidoscopy) you may notice spotting of blood in your stool or on the toilet paper. Some abdominal soreness may be present for a day or two, also. ° °DIET: Your first meal following the procedure should be a light meal and then it is ok to progress to your normal diet. A half-sandwich or bowl of soup is an example of a good first meal. Heavy or fried foods are harder to digest and may make you feel nauseous or bloated. Drink plenty of fluids but you should avoid alcoholic beverages for 24 hours. If you had a esophageal dilation, please see attached instructions for diet.   ° °ACTIVITY: Your care partner should take you home directly after the procedure. You should plan to take it easy, moving slowly for the rest of the day. You can resume normal activity the day after the procedure however YOU SHOULD NOT DRIVE, use power tools, machinery or perform tasks that involve climbing or major physical exertion for 24 hours (because of the sedation medicines used during the test).  ° °SYMPTOMS TO REPORT IMMEDIATELY: °A gastroenterologist can be reached at any hour. Please call [phone]  for any of the following symptoms:  °• Following lower endoscopy (colonoscopy, flexible sigmoidoscopy) °Excessive amounts of blood in the stool  °Significant tenderness, worsening of abdominal pains  °Swelling of the abdomen that is new, acute  °Fever of 100°  or higher  °• Following upper endoscopy (EGD, EUS, ERCP, esophageal dilation) °Vomiting of blood or coffee ground material  °New, significant abdominal pain  °New, significant chest pain or pain under the shoulder blades  °Painful or persistently difficult swallowing  °New shortness of breath  °Black, tarry-looking or red, bloody stools ° °FOLLOW UP:  °If any biopsies were taken you will be contacted by phone or by letter within the next 1-3 weeks. Call [phone]  if you have not heard about the biopsies in 3 weeks.  °Please also call with any specific questions about appointments or follow up tests.YOU HAD AN ENDOSCOPIC PROCEDURE TODAY: Refer to the procedure report and other information in the discharge instructions given to you for any specific questions about what was found during the examination. If this information does not answer your questions, please call [place] office at [phone] to clarify.  ° °YOU SHOULD EXPECT: Some feelings of bloating in the abdomen. Passage of more gas than usual. Walking can help get rid of the air that was put into your GI tract during the procedure and reduce the bloating. If you had a lower endoscopy (such as a colonoscopy or flexible sigmoidoscopy) you may notice spotting of blood in your stool or on the toilet paper. Some abdominal soreness may be present for a day or two, also. ° °DIET: Your first meal following the procedure should be a light meal and then it is ok to progress to your normal diet. A half-sandwich or bowl of soup is an example   of a good first meal. Heavy or fried foods are harder to digest and may make you feel nauseous or bloated. Drink plenty of fluids but you should avoid alcoholic beverages for 24 hours. If you had a esophageal dilation, please see attached instructions for diet.   ° °ACTIVITY: Your care partner should take you home directly after the procedure. You should plan to take it easy, moving slowly for the rest of the day. You can resume  normal activity the day after the procedure however YOU SHOULD NOT DRIVE, use power tools, machinery or perform tasks that involve climbing or major physical exertion for 24 hours (because of the sedation medicines used during the test).  ° °SYMPTOMS TO REPORT IMMEDIATELY: °A gastroenterologist can be reached at any hour. Please call [phone]  for any of the following symptoms:  °• Following upper endoscopy (EGD, EUS, ERCP, esophageal dilation) °Vomiting of blood or coffee ground material  °New, significant abdominal pain  °New, significant chest pain or pain under the shoulder blades  °Painful or persistently difficult swallowing  °New shortness of breath  °Black, tarry-looking or red, bloody stools ° °FOLLOW UP:  °If any biopsies were taken you will be contacted by phone or by letter within the next 1-3 weeks. Call [phone]  if you have not heard about the biopsies in 3 weeks.  °Please also call with any specific questions about appointments or follow up tests. °

## 2017-06-06 NOTE — Anesthesia Postprocedure Evaluation (Signed)
Anesthesia Post Note  Patient: [name]  Procedure(s) Performed: UPPER ENDOSCOPIC ULTRASOUND (EUS) RADIAL (N/A )     Patient location during evaluation: PACU Anesthesia Type: MAC Level of consciousness: awake and alert Pain management: pain level controlled Vital Signs Assessment: post-procedure vital signs reviewed and stable Respiratory status: spontaneous breathing, nonlabored ventilation, respiratory function stable and patient connected to nasal cannula oxygen Cardiovascular status: stable and blood pressure returned to baseline Postop Assessment: no apparent nausea or vomiting Anesthetic complications: no    Last Vitals:  Vitals:   06/06/17 1320 06/06/17 1330  BP: 116/64 139/80  Pulse: 88 80  Resp: 13 12  Temp:    SpO2: 95% 96%    Last Pain:  Vitals:   06/06/17 1330  TempSrc:   PainSc: 8                  Kaylan Yates DAVID

## 2017-06-06 NOTE — Op Note (Signed)
[facility] Patient Name: [name] Procedure Date: 06/06/2017 MRN: [id number] Attending MD: Milus Banister , MD Date of Birth: 06/17/1965 CSN: [id number] Age: 52 Admit Type: Outpatient Procedure:                Upper EUS Indications:              Bile duct dilation on CT and MR; no obvious masses                            or stones on either exam. + significant weight                            loss, normal liver tests. Providers:                Milus Banister, MD, Vista Lawman, RN, Laurena Spies, Technician, Cletis Athens, Technician Referring MD:             Carmell Austria, MD Medicines:                Monitored Anesthesia Care Complications:            No immediate complications. Estimated blood loss:                            None. Estimated Blood Loss:     Estimated blood loss: none. Procedure:                Pre-Anesthesia Assessment:                           - Prior to the procedure, a History and Physical                            was performed, and patient medications and                            allergies were reviewed. The patient's tolerance of                            previous anesthesia was also reviewed. The risks                            and benefits of the procedure and the sedation                            options and risks were discussed with the patient.                            All questions were answered, and informed consent                            was obtained. Prior Anticoagulants: The patient has  taken no previous anticoagulant or antiplatelet                            agents. ASA Grade Assessment: II - A patient with                            mild systemic disease. After reviewing the risks                            and benefits, the patient was deemed in                            satisfactory condition to undergo the procedure.                           After  obtaining informed consent, the endoscope was                            passed under direct vision. Throughout the                            procedure, the patient's blood pressure, pulse, and                            oxygen saturations were monitored continuously. The                            Duodenoscope was introduced through the mouth, and                            advanced to the second part of duodenum. The                            [id number] ([id number]) scope was introduced through                            the mouth, and advanced to the second part of                            duodenum. The upper EUS was accomplished without                            difficulty. The patient tolerated the procedure                            well. Scope In: Scope Out: Findings:      Endoscopic Finding :      1. The UGI tract was normal except for slightly bulbous appearing major       papilla (evaluated with radial echoendoscope and side viewing       duodenoscope).      2. The major papilla was very slightly bulbous appearing. This was not       overtly neoplastic. Mucosal biospies were taken with forceps.      Endosonographic Finding :  1. CBD was dilated (37mm) but it tapered smoothly into the region of the       major papilla. There were no stones or soft tissue lesions in the bile       duct.      2. The pancreatic parenchyma was normal throughout the gland.      3. No subepithelial duodenal lesions.      4. No periportal, peripancreatic adenopathy.      5. Main pancreatic duct was normal, non-dilated Impression:               - Very slightly bulbous major papilla without                            evident submucosal lesion or overt neoplastic                            appearing mucosa. Mucosal biopsies taken (await                            final pathology)                           - The biliary tree was dilated without clear                            pathology (no  biliary stones, masses, no pancreatic                            or duodenal masses). Given normal liver tests it is                            not clear that the bile duct dilation is related to                            her weight loss. Would continue to look for other                            causes. Moderate Sedation:      N/A- Per Anesthesia Care Recommendation:           - Discharge patient to home (ambulatory).                           - Follow up with Dr. Lyndel Safe, PCP for further testing. Procedure Code(s):        --- Professional ---                           [phone], Esophagogastroduodenoscopy, flexible,                            transoral; with endoscopic ultrasound examination                            limited to the esophagus, stomach or duodenum, and  adjacent structures                           43239, 59, Esophagogastroduodenoscopy, flexible,                            transoral; with biopsy, single or multiple Diagnosis Code(s):        --- Professional ---                           K29.70, Gastritis, unspecified, without bleeding                           K83.8, Other specified diseases of biliary tract CPT copyright 2016 American Medical Association. All rights reserved. The codes documented in this report are preliminary and upon coder review may  be revised to meet current compliance requirements. Milus Banister, MD 06/06/2017 1:20:00 PM This report has been signed electronically. Number of Addenda: 0

## 2017-06-06 NOTE — Transfer of Care (Signed)
Immediate Anesthesia Transfer of Care Note  Patient: [name]  Procedure(s) Performed: Procedure(s): UPPER ENDOSCOPIC ULTRASOUND (EUS) RADIAL (N/A)  Patient Location: PACU  Anesthesia Type:MAC  Level of Consciousness:  sedated, patient cooperative and responds to stimulation  Airway & Oxygen Therapy:Patient Spontanous Breathing and Patient connected to face mask oxgen  Post-op Assessment:  Report given to PACU RN and Post -op Vital signs reviewed and stable  Post vital signs:  Reviewed and stable  Last Vitals:  Vitals:   06/06/17 1108  BP: (!) 164/92  Pulse: 89  Resp: 13  Temp: 36.6 C  SpO2: 22%    Complications: No apparent anesthesia complications

## 2017-06-06 NOTE — Anesthesia Preprocedure Evaluation (Signed)
Anesthesia Evaluation  Patient identified by MRN, date of birth, ID band Patient awake    Reviewed: Allergy & Precautions, NPO status , Patient's Chart, lab work & pertinent test results  Airway Mallampati: I  TM Distance: >3 FB Neck ROM: Full    Dental   Pulmonary Current Smoker,    Pulmonary exam normal        Cardiovascular hypertension, Pt. on medications Normal cardiovascular exam     Neuro/Psych Anxiety Depression    GI/Hepatic GERD  Medicated and Controlled,  Endo/Other    Renal/GU      Musculoskeletal   Abdominal   Peds  Hematology   Anesthesia Other Findings   Reproductive/Obstetrics                             Anesthesia Physical Anesthesia Plan  ASA: II  Anesthesia Plan: MAC   Post-op Pain Management:    Induction: Intravenous  PONV Risk Score and Plan: 2 and Treatment may vary due to age or medical condition  Airway Management Planned: Simple Face Mask  Additional Equipment:   Intra-op Plan:   Post-operative Plan:   Informed Consent: I have reviewed the patients History and Physical, chart, labs and discussed the procedure including the risks, benefits and alternatives for the proposed anesthesia with the patient or authorized representative who has indicated his/her understanding and acceptance.     Plan Discussed with: CRNA and Surgeon  Anesthesia Plan Comments:         Anesthesia Quick Evaluation

## 2017-06-06 NOTE — H&P (Signed)
  HPI: This is a very pleasant 52 yo woman  Chief complaint is weight loss, dilated bile ducts, normal liver tests  ROS: complete GI ROS as described in HPI, all other review negative.  Constitutional:  No unintentional weight loss   Past Medical History:  Diagnosis Date  . Anxiety   . Arthritis   . Depression   . GERD (gastroesophageal reflux disease)   . Headache    migraines  . Hypertension     Past Surgical History:  Procedure Laterality Date  . BRAIN SURGERY     2012 for brain bleed    Current Facility-Administered Medications  Medication Dose Route Frequency Provider Last Rate Last Dose  . 0.9 %  sodium chloride infusion   Intravenous Continuous Milus Banister, MD      . lactated ringers infusion   Intravenous Continuous Milus Banister, MD 10 mL/hr at 06/06/17 1130      Allergies as of 05/16/2017  . (Not on File)    History reviewed. No pertinent family history.  Social History   Socioeconomic History  . Marital status: Divorced    Spouse name: Not on file  . Number of children: Not on file  . Years of education: Not on file  . Highest education level: Not on file  Social Needs  . Financial resource strain: Not on file  . Food insecurity - worry: Not on file  . Food insecurity - inability: Not on file  . Transportation needs - medical: Not on file  . Transportation needs - non-medical: Not on file  Occupational History  . Not on file  Tobacco Use  . Smoking status: Current Every Day Smoker    Types: Cigarettes  . Smokeless tobacco: Never Used  Substance and Sexual Activity  . Alcohol use: No    Frequency: Never    Comment: none in 4-5 years  . Drug use: No  . Sexual activity: Not Currently  Other Topics Concern  . Not on file  Social History Narrative  . Not on file     Physical Exam: BP (!) 164/92   Pulse 89   Temp 97.8 F (36.6 C) (Oral)   Resp 13   Ht 5\' 1"  (1.549 m)   Wt 94 lb 5 oz (42.8 kg)   SpO2 92%   BMI 17.82 kg/m   Constitutional: generally well-appearing Psychiatric: alert and oriented x3 Abdomen: soft, nontender, nondistended, no obvious ascites, no peritoneal signs, normal bowel sounds No peripheral edema noted in lower extremities  Assessment and plan: 52 y.o. female with abnromal bile ducts, weigth loss, normal lfts  For upper EUS evalation today  Please see the "Patient Instructions" section for addition details about the plan.  Owens Loffler, MD [facility] 06/06/2017, 12:31 PM

## 2017-06-07 ENCOUNTER — Encounter (HOSPITAL_COMMUNITY): Payer: Self-pay | Admitting: Gastroenterology

## 2017-12-25 ENCOUNTER — Ambulatory Visit: Payer: Self-pay | Admitting: Allergy and Immunology

## 2018-01-07 ENCOUNTER — Ambulatory Visit (INDEPENDENT_AMBULATORY_CARE_PROVIDER_SITE_OTHER): Payer: Medicare Other | Admitting: Allergy

## 2018-01-07 ENCOUNTER — Encounter: Payer: Self-pay | Admitting: Allergy

## 2018-01-07 VITALS — BP 132/82 | HR 60 | Resp 20 | Ht 60.2 in | Wt 94.2 lb

## 2018-01-07 DIAGNOSIS — H1010 Acute atopic conjunctivitis, unspecified eye: Secondary | ICD-10-CM | POA: Diagnosis not present

## 2018-01-07 DIAGNOSIS — J309 Allergic rhinitis, unspecified: Secondary | ICD-10-CM | POA: Diagnosis not present

## 2018-01-07 MED ORDER — CARBINOXAMINE MALEATE 6 MG PO TABS: 1.0000 | ORAL_TABLET | Freq: Two times a day (BID) | ORAL | 5 refills | Status: DC

## 2018-01-07 MED ORDER — AZELASTINE HCL 0.1 % NA SOLN: NASAL | 5 refills | Status: DC

## 2018-01-07 NOTE — Patient Instructions (Addendum)
Allergic rhinoconjunctivitis with severe nasal congestion  - near complete nasal congestion (worse in right nostril then left nostril)  - continue nasal saline rinse/spray to help flush out nose and keep nose moisturized.  Use prior to nasal spray use.   - for nasal drainage/post-nasal drip use nasal antihistamine, Astelin 2 sprays each nostril twice a day  - for nasal congestion recommend course of prednisone 20mg  twice a day for next 5 days to help decongest the nose  - for management of nasal congestion use Nasacort or Rhinocort 1-2 sprays daily 3 times a week (M/W/F) to help decrease risk of nosebleeds  - stop use of nasal decongestant  - does not appear that xyzal is effective and would stop.  Will trial either Ryvent, Ryclora or Karbinal at this time to determine if antihistamines will be effective  - environmental allergy testing today is positive to birch tree pollen and alternaria mold.  Allergen avoidance measures provided.    Follow-up 2 months or sooner if needed

## 2018-01-07 NOTE — Progress Notes (Signed)
New Patient Note  RE: [name] MRN: [id number] DOB: 1965-09-20 Date of Office Visit: 01/07/2018  Referring provider: Townsend Roger, MD Primary care provider: Nona Dell, Corene Cornea, MD  Chief Complaint: allergy symptoms  History of present illness: [name] is a 52 y.o. female presenting today for consultation for allergies.    She reports nasal congestion primarily with some runny nose and she does report throat clearing a lot with sore throat.  She denies sneezing.  Sometimes she does have mild itchiness of eyes but denies redness or watery.  She also reports having daily migraines as well.  She did receive a sample of aimovig from PCP but states her insurance denied coverage thus states she is still working with her PCP for migraine treatment.  She denies any significant history of sinus infections. She did see Dr. Gaylyn Cheers, local ENT around Sept 2018 and states nothing abnormal was found.  She has not been back.  She feels her allergy symptoms appear to have worsened after she got a new dog about 2 years ago.   She uses saline spray to help loosen mucus and then she blows her nose to get the drainage out.   She also states she has taken mucinex sometimes to help loosen up the mucus.  She is currently on xyzal now for about a year.   She also states for the past 1-2 weeks she has been using 1 spray of a nasal decongestant.  She has tried Human resources officer, claritin and zyrtec before as well without much relief of symptoms.  She was on singulair for several years and stopped as it was not working.  She states flonase makes her nose bleed.   She had allergy testing performed in her 73s and states she was allergic to "spring time" allergens.    No history of eczema, asthma or food allergy.    Review of systems: Review of Systems  Constitutional: Negative for chills, fever and malaise/fatigue.  HENT: Positive for congestion and ear pain. Negative for ear discharge, hearing loss, nosebleeds, sinus  pain, sore throat and tinnitus.   Eyes: Negative for pain, discharge and redness.  Respiratory: Negative for cough, shortness of breath and wheezing.   Cardiovascular: Negative for chest pain.  Gastrointestinal: Negative for abdominal pain, constipation, diarrhea, heartburn, nausea and vomiting.  Musculoskeletal: Positive for joint pain.  Skin: Negative for itching and rash.  Neurological: Positive for headaches.    All other systems negative unless noted above in HPI  Past medical history: Past Medical History:  Diagnosis Date  . Anxiety   . Arthritis   . Depression   . GERD (gastroesophageal reflux disease)   . Headache    migraines  . Hypertension   . Migraines   . Subdural hematoma ([place])   . Traumatic brain injury [facility])     Past surgical history: Past Surgical History:  Procedure Laterality Date  . BRAIN SURGERY     2012 for brain bleed  . BREAST ENHANCEMENT SURGERY    . EUS N/A 06/06/2017   Procedure: UPPER ENDOSCOPIC ULTRASOUND (EUS) RADIAL;  Surgeon: Milus Banister, MD;  Location: [facility];  Service: Endoscopy;  Laterality: N/A;    Family history:  Family History  Problem Relation Age of Onset  . Cancer Mother        Spindle Cell  . Prostate cancer Father     Social history: Lives in a townhome without carpeting with gas heating and central cooling.  No  concern for water damage, mildew or roaches in the home.  Dog in the home.  She does not work.   Tobacco Use  . Smoking status: Current Every Day Smoker   3/4 packs    Types: Cigarettes  . Smokeless tobacco: Never Used    Medication List: Allergies as of 01/07/2018      Reactions   Penicillins    Has patient had a PCN reaction causing immediate rash, facial/tongue/throat swelling, SOB or lightheadedness with hypotension: no Has patient had a PCN reaction causing severe rash involving mucus membranes or skin necrosis: no Has patient had a PCN reaction that required hospitalization: no Has patient  had a PCN reaction occurring within the last 10 years: no If all of the above answers are "NO", then may proceed with Cephalosporin use.   Bactrim [sulfamethoxazole-trimethoprim] Rash   Rash all over      Medication List        Accurate as of 01/07/18  4:20 PM. Always use your most recent med list.          alendronate 70 MG tablet Commonly known as:  FOSAMAX Take 70 mg by mouth once a week. Take with a full glass of water on an empty stomach.  Tuesdays   butalbital-acetaminophen-caffeine 50-325-40 MG tablet Commonly known as:  FIORICET, ESGIC TAKE 1 TABLET BY MOUTH EVERY 4 HOURS AS NEEDED. Do not exceed 6 TABLETS PER 24 HOURS FOR 10 DAYS   CALCIUM+D3 600-800 MG-UNIT Tabs Generic drug:  Calcium Carb-Cholecalciferol Take 1 tablet by mouth 2 (two) times daily.   EMBEDA 80-3.2 MG Cpcr Generic drug:  Morphine-Naltrexone Take 1 capsule by mouth daily.   Fish Oil 1000 MG Caps Take 1 capsule by mouth daily.   fluticasone 50 MCG/ACT nasal spray Commonly known as:  FLONASE   gabapentin 800 MG tablet Commonly known as:  NEURONTIN Take 800 mg by mouth 3 (three) times daily.   levocetirizine 5 MG tablet Commonly known as:  XYZAL Take 5 mg by mouth every evening.   lidocaine 4 % cream Commonly known as:  LMX Apply 1 application topically 3 (three) times daily as needed (back pain).   lisinopril 10 MG tablet Commonly known as:  PRINIVIL,ZESTRIL Take 10 mg by mouth daily.   meloxicam 7.5 MG tablet Commonly known as:  MOBIC Take 7.5 mg by mouth daily.   morphine 15 MG tablet Commonly known as:  MSIR Take 15 mg by mouth 2 (two) times daily.   multivitamin capsule Take 1 capsule by mouth daily.   omeprazole 40 MG capsule Commonly known as:  PRILOSEC Take 40 mg by mouth 2 (two) times daily.   ondansetron 4 MG disintegrating tablet Commonly known as:  ZOFRAN-ODT Take 4 mg by mouth every 8 (eight) hours as needed for nausea or vomiting.   oxybutynin 10 MG 24 hr  tablet Commonly known as:  DITROPAN-XL Take 1 tablet by mouth daily.   PEG 3350 Powd   phenylephrine 10 MG Tabs tablet Commonly known as:  SUDAFED PE Take by mouth.   promethazine 25 MG tablet Commonly known as:  PHENERGAN Take 25 mg by mouth every 6 (six) hours as needed for nausea or vomiting.   propranolol 10 MG tablet Commonly known as:  INDERAL Take 10 mg by mouth 3 (three) times daily.   SUMAtriptan 100 MG tablet Commonly known as:  IMITREX Take 100 mg by mouth every 2 (two) hours as needed for migraine. May repeat in 2 hours if headache  persists or recurs.   SYMPROIC 0.2 MG Tabs Generic drug:  Naldemedine Tosylate Take 1 tablet by mouth as needed.   topiramate 100 MG tablet Commonly known as:  TOPAMAX Take 1 tablet by mouth 2 (two) times daily.   triamcinolone 0.1 % paste Commonly known as:  KENALOG Use as directed 1 application in the mouth or throat 4 (four) times daily. Mouth ulcer       Known medication allergies: Allergies  Allergen Reactions  . Penicillins     Has patient had a PCN reaction causing immediate rash, facial/tongue/throat swelling, SOB or lightheadedness with hypotension: no Has patient had a PCN reaction causing severe rash involving mucus membranes or skin necrosis: no Has patient had a PCN reaction that required hospitalization: no Has patient had a PCN reaction occurring within the last 10 years: no If all of the above answers are "NO", then may proceed with Cephalosporin use.   . Bactrim [Sulfamethoxazole-Trimethoprim] Rash    Rash all over     Physical examination: Blood pressure 132/82, pulse 60, resp. rate 20, height 5' 0.2" (1.529 m), weight 94 lb 3.2 oz (42.7 kg).  General: Alert, interactive, in no acute distress. HEENT: PERRLA, TMs pearly gray, turbinates markedly edematous with complete obstruction of Rt nares and near complete obstruction of lt nares with clear discharge, post-pharynx non erythematous. Neck: Supple  without lymphadenopathy. Lungs: Clear to auscultation without wheezing, rhonchi or rales. {no increased work of breathing. CV: Normal S1, S2 without murmurs. Abdomen: Nondistended, nontender. Skin: Warm and dry, without lesions or rashes. Extremities:  No clubbing, cyanosis or edema. Neuro:   Grossly intact.  Diagnositics/Labs:  Allergy testing: environmental allergy skin prick testing is positive to birch and alternaria.  Intradermal testing is negative.  Allergy testing results were read and interpreted by provider, documented by clinical staff.   Assessment and plan:   Allergic rhinoconjunctivitis with severe nasal congestion  - near complete nasal obstruction (worse in right nostril then left nostril)  - continue nasal saline rinse/spray to help flush out nose and keep nose moisturized.  Use prior to nasal spray use.   - for nasal drainage/post-nasal drip use nasal antihistamine, Astelin 2 sprays each nostril twice a day  - for nasal congestion recommend course of prednisone 20mg  twice a day for next 5 days to help decongest the nose  - for management of nasal congestion use Nasacort or Rhinocort 1-2 sprays daily 3 times a week (M/W/F) to help decrease risk of nosebleeds  - stop use of nasal decongestant  - does not appear that xyzal is effective and would stop.  Will trial either Ryvent, Ryclora or Karbinal at this time to determine if antihistamines will be effective  - environmental allergy testing today is positive to birch tree pollen and alternaria mold.  Allergen avoidance measures provided.    Follow-up 2 months or sooner if needed  I appreciate the opportunity to take part in Chondra's care. Please do not hesitate to contact me with questions.  Sincerely,   Prudy Feeler, MD Allergy/Immunology [facility]

## 2018-03-11 ENCOUNTER — Ambulatory Visit: Payer: Medicare Other | Admitting: Allergy

## 2018-06-06 ENCOUNTER — Other Ambulatory Visit: Payer: Self-pay | Admitting: Allergy

## 2018-07-15 DIAGNOSIS — R748 Abnormal levels of other serum enzymes: Secondary | ICD-10-CM | POA: Diagnosis not present

## 2018-07-15 DIAGNOSIS — R4182 Altered mental status, unspecified: Secondary | ICD-10-CM | POA: Diagnosis not present

## 2018-07-15 DIAGNOSIS — N179 Acute kidney failure, unspecified: Secondary | ICD-10-CM

## 2018-07-15 DIAGNOSIS — Z008 Encounter for other general examination: Secondary | ICD-10-CM

## 2018-07-15 DIAGNOSIS — E871 Hypo-osmolality and hyponatremia: Secondary | ICD-10-CM | POA: Diagnosis not present

## 2018-07-16 DIAGNOSIS — N179 Acute kidney failure, unspecified: Secondary | ICD-10-CM | POA: Diagnosis not present

## 2018-07-16 DIAGNOSIS — R748 Abnormal levels of other serum enzymes: Secondary | ICD-10-CM | POA: Diagnosis not present

## 2018-07-16 DIAGNOSIS — E785 Hyperlipidemia, unspecified: Secondary | ICD-10-CM

## 2018-07-16 DIAGNOSIS — I10 Essential (primary) hypertension: Secondary | ICD-10-CM

## 2018-07-17 DIAGNOSIS — I10 Essential (primary) hypertension: Secondary | ICD-10-CM | POA: Diagnosis not present

## 2018-07-17 DIAGNOSIS — E785 Hyperlipidemia, unspecified: Secondary | ICD-10-CM | POA: Diagnosis not present

## 2018-07-17 DIAGNOSIS — N179 Acute kidney failure, unspecified: Secondary | ICD-10-CM | POA: Diagnosis not present

## 2018-07-17 DIAGNOSIS — R748 Abnormal levels of other serum enzymes: Secondary | ICD-10-CM | POA: Diagnosis not present

## 2018-11-21 DIAGNOSIS — J3489 Other specified disorders of nose and nasal sinuses: Secondary | ICD-10-CM | POA: Insufficient documentation

## 2019-07-10 DIAGNOSIS — M25562 Pain in left knee: Secondary | ICD-10-CM | POA: Diagnosis not present

## 2019-07-10 DIAGNOSIS — M1712 Unilateral primary osteoarthritis, left knee: Secondary | ICD-10-CM | POA: Diagnosis not present

## 2019-07-21 DIAGNOSIS — M47816 Spondylosis without myelopathy or radiculopathy, lumbar region: Secondary | ICD-10-CM | POA: Diagnosis not present

## 2019-07-21 DIAGNOSIS — M48061 Spinal stenosis, lumbar region without neurogenic claudication: Secondary | ICD-10-CM | POA: Diagnosis not present

## 2019-07-21 DIAGNOSIS — M533 Sacrococcygeal disorders, not elsewhere classified: Secondary | ICD-10-CM | POA: Diagnosis not present

## 2019-08-13 DIAGNOSIS — M25562 Pain in left knee: Secondary | ICD-10-CM | POA: Diagnosis not present

## 2019-08-13 DIAGNOSIS — M25462 Effusion, left knee: Secondary | ICD-10-CM | POA: Diagnosis not present

## 2019-08-13 DIAGNOSIS — S83272A Complex tear of lateral meniscus, current injury, left knee, initial encounter: Secondary | ICD-10-CM | POA: Diagnosis not present

## 2019-08-19 DIAGNOSIS — M533 Sacrococcygeal disorders, not elsewhere classified: Secondary | ICD-10-CM | POA: Diagnosis not present

## 2019-08-19 DIAGNOSIS — M48061 Spinal stenosis, lumbar region without neurogenic claudication: Secondary | ICD-10-CM | POA: Diagnosis not present

## 2019-08-19 DIAGNOSIS — M47816 Spondylosis without myelopathy or radiculopathy, lumbar region: Secondary | ICD-10-CM | POA: Diagnosis not present

## 2019-09-14 DIAGNOSIS — G43109 Migraine with aura, not intractable, without status migrainosus: Secondary | ICD-10-CM | POA: Diagnosis not present

## 2019-09-14 DIAGNOSIS — M810 Age-related osteoporosis without current pathological fracture: Secondary | ICD-10-CM | POA: Diagnosis not present

## 2019-09-14 DIAGNOSIS — I10 Essential (primary) hypertension: Secondary | ICD-10-CM | POA: Diagnosis not present

## 2019-09-15 DIAGNOSIS — M47816 Spondylosis without myelopathy or radiculopathy, lumbar region: Secondary | ICD-10-CM | POA: Diagnosis not present

## 2019-09-15 DIAGNOSIS — M48061 Spinal stenosis, lumbar region without neurogenic claudication: Secondary | ICD-10-CM | POA: Diagnosis not present

## 2019-09-15 DIAGNOSIS — M533 Sacrococcygeal disorders, not elsewhere classified: Secondary | ICD-10-CM | POA: Diagnosis not present

## 2019-09-18 DIAGNOSIS — M25531 Pain in right wrist: Secondary | ICD-10-CM | POA: Diagnosis not present

## 2019-09-23 DIAGNOSIS — Z1231 Encounter for screening mammogram for malignant neoplasm of breast: Secondary | ICD-10-CM | POA: Diagnosis not present

## 2019-09-23 DIAGNOSIS — M810 Age-related osteoporosis without current pathological fracture: Secondary | ICD-10-CM | POA: Diagnosis not present

## 2019-09-23 DIAGNOSIS — M818 Other osteoporosis without current pathological fracture: Secondary | ICD-10-CM | POA: Diagnosis not present

## 2019-09-29 DIAGNOSIS — G43109 Migraine with aura, not intractable, without status migrainosus: Secondary | ICD-10-CM | POA: Diagnosis not present

## 2019-09-29 DIAGNOSIS — G43909 Migraine, unspecified, not intractable, without status migrainosus: Secondary | ICD-10-CM | POA: Diagnosis not present

## 2019-10-13 DIAGNOSIS — M47816 Spondylosis without myelopathy or radiculopathy, lumbar region: Secondary | ICD-10-CM | POA: Diagnosis not present

## 2019-10-13 DIAGNOSIS — M1712 Unilateral primary osteoarthritis, left knee: Secondary | ICD-10-CM | POA: Diagnosis not present

## 2019-10-13 DIAGNOSIS — S83282A Other tear of lateral meniscus, current injury, left knee, initial encounter: Secondary | ICD-10-CM | POA: Diagnosis not present

## 2019-10-13 DIAGNOSIS — G894 Chronic pain syndrome: Secondary | ICD-10-CM | POA: Diagnosis not present

## 2019-10-13 DIAGNOSIS — K5903 Drug induced constipation: Secondary | ICD-10-CM | POA: Diagnosis not present

## 2019-11-17 DIAGNOSIS — S83282A Other tear of lateral meniscus, current injury, left knee, initial encounter: Secondary | ICD-10-CM | POA: Diagnosis not present

## 2019-11-17 DIAGNOSIS — M1712 Unilateral primary osteoarthritis, left knee: Secondary | ICD-10-CM | POA: Diagnosis not present

## 2019-11-17 DIAGNOSIS — Z1389 Encounter for screening for other disorder: Secondary | ICD-10-CM | POA: Diagnosis not present

## 2019-12-04 DIAGNOSIS — M545 Low back pain: Secondary | ICD-10-CM | POA: Diagnosis not present

## 2019-12-04 DIAGNOSIS — M47816 Spondylosis without myelopathy or radiculopathy, lumbar region: Secondary | ICD-10-CM | POA: Diagnosis not present

## 2019-12-04 DIAGNOSIS — M546 Pain in thoracic spine: Secondary | ICD-10-CM | POA: Diagnosis not present

## 2019-12-15 DIAGNOSIS — S83282A Other tear of lateral meniscus, current injury, left knee, initial encounter: Secondary | ICD-10-CM | POA: Diagnosis not present

## 2019-12-15 DIAGNOSIS — G43119 Migraine with aura, intractable, without status migrainosus: Secondary | ICD-10-CM | POA: Diagnosis not present

## 2019-12-15 DIAGNOSIS — M1712 Unilateral primary osteoarthritis, left knee: Secondary | ICD-10-CM | POA: Diagnosis not present

## 2019-12-15 DIAGNOSIS — I10 Essential (primary) hypertension: Secondary | ICD-10-CM | POA: Diagnosis not present

## 2019-12-15 DIAGNOSIS — G894 Chronic pain syndrome: Secondary | ICD-10-CM | POA: Diagnosis not present

## 2019-12-15 DIAGNOSIS — W57XXXA Bitten or stung by nonvenomous insect and other nonvenomous arthropods, initial encounter: Secondary | ICD-10-CM | POA: Diagnosis not present

## 2019-12-15 DIAGNOSIS — Z79891 Long term (current) use of opiate analgesic: Secondary | ICD-10-CM | POA: Diagnosis not present

## 2019-12-18 DIAGNOSIS — G8929 Other chronic pain: Secondary | ICD-10-CM | POA: Diagnosis not present

## 2019-12-18 DIAGNOSIS — M25531 Pain in right wrist: Secondary | ICD-10-CM | POA: Diagnosis not present

## 2020-01-12 DIAGNOSIS — B351 Tinea unguium: Secondary | ICD-10-CM | POA: Diagnosis not present

## 2020-01-12 DIAGNOSIS — M1712 Unilateral primary osteoarthritis, left knee: Secondary | ICD-10-CM | POA: Diagnosis not present

## 2020-01-12 DIAGNOSIS — B356 Tinea cruris: Secondary | ICD-10-CM | POA: Diagnosis not present

## 2020-01-12 DIAGNOSIS — S83282A Other tear of lateral meniscus, current injury, left knee, initial encounter: Secondary | ICD-10-CM | POA: Diagnosis not present

## 2020-01-12 DIAGNOSIS — G894 Chronic pain syndrome: Secondary | ICD-10-CM | POA: Diagnosis not present

## 2020-01-14 DIAGNOSIS — M1712 Unilateral primary osteoarthritis, left knee: Secondary | ICD-10-CM | POA: Diagnosis not present

## 2020-01-14 DIAGNOSIS — M25562 Pain in left knee: Secondary | ICD-10-CM | POA: Diagnosis not present

## 2020-01-14 DIAGNOSIS — M179 Osteoarthritis of knee, unspecified: Secondary | ICD-10-CM | POA: Diagnosis not present

## 2020-01-27 DIAGNOSIS — Z1212 Encounter for screening for malignant neoplasm of rectum: Secondary | ICD-10-CM | POA: Diagnosis not present

## 2020-01-27 DIAGNOSIS — Z1211 Encounter for screening for malignant neoplasm of colon: Secondary | ICD-10-CM | POA: Diagnosis not present

## 2020-02-09 DIAGNOSIS — K5903 Drug induced constipation: Secondary | ICD-10-CM | POA: Diagnosis not present

## 2020-02-09 DIAGNOSIS — G894 Chronic pain syndrome: Secondary | ICD-10-CM | POA: Diagnosis not present

## 2020-03-08 DIAGNOSIS — K5903 Drug induced constipation: Secondary | ICD-10-CM | POA: Diagnosis not present

## 2020-03-08 DIAGNOSIS — J329 Chronic sinusitis, unspecified: Secondary | ICD-10-CM | POA: Diagnosis not present

## 2020-03-08 DIAGNOSIS — S83282A Other tear of lateral meniscus, current injury, left knee, initial encounter: Secondary | ICD-10-CM | POA: Diagnosis not present

## 2020-03-08 DIAGNOSIS — G43909 Migraine, unspecified, not intractable, without status migrainosus: Secondary | ICD-10-CM | POA: Diagnosis not present

## 2020-03-08 DIAGNOSIS — M1712 Unilateral primary osteoarthritis, left knee: Secondary | ICD-10-CM | POA: Diagnosis not present

## 2020-03-08 DIAGNOSIS — Z23 Encounter for immunization: Secondary | ICD-10-CM | POA: Diagnosis not present

## 2020-03-29 DIAGNOSIS — M25531 Pain in right wrist: Secondary | ICD-10-CM | POA: Diagnosis not present

## 2020-03-29 DIAGNOSIS — G8929 Other chronic pain: Secondary | ICD-10-CM | POA: Diagnosis not present

## 2020-04-04 DIAGNOSIS — D485 Neoplasm of uncertain behavior of skin: Secondary | ICD-10-CM | POA: Diagnosis not present

## 2020-04-04 DIAGNOSIS — L920 Granuloma annulare: Secondary | ICD-10-CM | POA: Diagnosis not present

## 2020-04-05 DIAGNOSIS — S83282A Other tear of lateral meniscus, current injury, left knee, initial encounter: Secondary | ICD-10-CM | POA: Diagnosis not present

## 2020-04-05 DIAGNOSIS — M1712 Unilateral primary osteoarthritis, left knee: Secondary | ICD-10-CM | POA: Diagnosis not present

## 2020-04-05 DIAGNOSIS — K5903 Drug induced constipation: Secondary | ICD-10-CM | POA: Diagnosis not present

## 2020-04-15 DIAGNOSIS — M25562 Pain in left knee: Secondary | ICD-10-CM | POA: Diagnosis not present

## 2020-05-03 DIAGNOSIS — M1712 Unilateral primary osteoarthritis, left knee: Secondary | ICD-10-CM | POA: Diagnosis not present

## 2020-05-03 DIAGNOSIS — S83282A Other tear of lateral meniscus, current injury, left knee, initial encounter: Secondary | ICD-10-CM | POA: Diagnosis not present

## 2020-05-03 DIAGNOSIS — M533 Sacrococcygeal disorders, not elsewhere classified: Secondary | ICD-10-CM | POA: Diagnosis not present

## 2020-05-03 DIAGNOSIS — Z0000 Encounter for general adult medical examination without abnormal findings: Secondary | ICD-10-CM | POA: Diagnosis not present

## 2020-05-03 DIAGNOSIS — G43109 Migraine with aura, not intractable, without status migrainosus: Secondary | ICD-10-CM | POA: Diagnosis not present

## 2020-05-03 DIAGNOSIS — I10 Essential (primary) hypertension: Secondary | ICD-10-CM | POA: Diagnosis not present

## 2020-05-03 DIAGNOSIS — Z1389 Encounter for screening for other disorder: Secondary | ICD-10-CM | POA: Diagnosis not present

## 2020-05-03 DIAGNOSIS — M810 Age-related osteoporosis without current pathological fracture: Secondary | ICD-10-CM | POA: Diagnosis not present

## 2020-05-03 DIAGNOSIS — M48061 Spinal stenosis, lumbar region without neurogenic claudication: Secondary | ICD-10-CM | POA: Diagnosis not present

## 2020-05-03 DIAGNOSIS — M5416 Radiculopathy, lumbar region: Secondary | ICD-10-CM | POA: Diagnosis not present

## 2020-05-03 DIAGNOSIS — M5136 Other intervertebral disc degeneration, lumbar region: Secondary | ICD-10-CM | POA: Diagnosis not present

## 2020-05-03 DIAGNOSIS — K219 Gastro-esophageal reflux disease without esophagitis: Secondary | ICD-10-CM | POA: Diagnosis not present

## 2020-05-03 DIAGNOSIS — Z8781 Personal history of (healed) traumatic fracture: Secondary | ICD-10-CM | POA: Diagnosis not present

## 2020-05-03 DIAGNOSIS — D649 Anemia, unspecified: Secondary | ICD-10-CM | POA: Diagnosis not present

## 2020-05-03 DIAGNOSIS — Z681 Body mass index (BMI) 19 or less, adult: Secondary | ICD-10-CM | POA: Diagnosis not present

## 2020-05-03 DIAGNOSIS — K5903 Drug induced constipation: Secondary | ICD-10-CM | POA: Diagnosis not present

## 2020-05-03 DIAGNOSIS — M47816 Spondylosis without myelopathy or radiculopathy, lumbar region: Secondary | ICD-10-CM | POA: Diagnosis not present

## 2020-05-03 DIAGNOSIS — G894 Chronic pain syndrome: Secondary | ICD-10-CM | POA: Diagnosis not present

## 2020-05-10 DIAGNOSIS — M25531 Pain in right wrist: Secondary | ICD-10-CM | POA: Diagnosis not present

## 2020-05-10 DIAGNOSIS — G8929 Other chronic pain: Secondary | ICD-10-CM | POA: Diagnosis not present

## 2020-05-24 DIAGNOSIS — M47816 Spondylosis without myelopathy or radiculopathy, lumbar region: Secondary | ICD-10-CM | POA: Diagnosis not present

## 2020-05-24 DIAGNOSIS — S83282A Other tear of lateral meniscus, current injury, left knee, initial encounter: Secondary | ICD-10-CM | POA: Diagnosis not present

## 2020-05-24 DIAGNOSIS — M5136 Other intervertebral disc degeneration, lumbar region: Secondary | ICD-10-CM | POA: Diagnosis not present

## 2020-05-24 DIAGNOSIS — M533 Sacrococcygeal disorders, not elsewhere classified: Secondary | ICD-10-CM | POA: Diagnosis not present

## 2020-05-24 DIAGNOSIS — Z8781 Personal history of (healed) traumatic fracture: Secondary | ICD-10-CM | POA: Diagnosis not present

## 2020-05-24 DIAGNOSIS — G894 Chronic pain syndrome: Secondary | ICD-10-CM | POA: Diagnosis not present

## 2020-05-24 DIAGNOSIS — M48061 Spinal stenosis, lumbar region without neurogenic claudication: Secondary | ICD-10-CM | POA: Diagnosis not present

## 2020-05-24 DIAGNOSIS — M5416 Radiculopathy, lumbar region: Secondary | ICD-10-CM | POA: Diagnosis not present

## 2020-05-24 DIAGNOSIS — K5903 Drug induced constipation: Secondary | ICD-10-CM | POA: Diagnosis not present

## 2020-05-24 DIAGNOSIS — M1712 Unilateral primary osteoarthritis, left knee: Secondary | ICD-10-CM | POA: Diagnosis not present

## 2020-05-26 DIAGNOSIS — H25013 Cortical age-related cataract, bilateral: Secondary | ICD-10-CM | POA: Diagnosis not present

## 2020-05-26 DIAGNOSIS — H18413 Arcus senilis, bilateral: Secondary | ICD-10-CM | POA: Diagnosis not present

## 2020-05-26 DIAGNOSIS — H2511 Age-related nuclear cataract, right eye: Secondary | ICD-10-CM | POA: Diagnosis not present

## 2020-05-26 DIAGNOSIS — H2513 Age-related nuclear cataract, bilateral: Secondary | ICD-10-CM | POA: Diagnosis not present

## 2020-05-26 DIAGNOSIS — H25043 Posterior subcapsular polar age-related cataract, bilateral: Secondary | ICD-10-CM | POA: Diagnosis not present

## 2020-05-31 DIAGNOSIS — M545 Low back pain, unspecified: Secondary | ICD-10-CM | POA: Diagnosis not present

## 2020-05-31 DIAGNOSIS — M47816 Spondylosis without myelopathy or radiculopathy, lumbar region: Secondary | ICD-10-CM | POA: Diagnosis not present

## 2020-06-21 DIAGNOSIS — K5903 Drug induced constipation: Secondary | ICD-10-CM | POA: Diagnosis not present

## 2020-06-21 DIAGNOSIS — M5416 Radiculopathy, lumbar region: Secondary | ICD-10-CM | POA: Diagnosis not present

## 2020-06-21 DIAGNOSIS — M47816 Spondylosis without myelopathy or radiculopathy, lumbar region: Secondary | ICD-10-CM | POA: Diagnosis not present

## 2020-06-21 DIAGNOSIS — M533 Sacrococcygeal disorders, not elsewhere classified: Secondary | ICD-10-CM | POA: Diagnosis not present

## 2020-06-21 DIAGNOSIS — M48061 Spinal stenosis, lumbar region without neurogenic claudication: Secondary | ICD-10-CM | POA: Diagnosis not present

## 2020-06-21 DIAGNOSIS — S83282A Other tear of lateral meniscus, current injury, left knee, initial encounter: Secondary | ICD-10-CM | POA: Diagnosis not present

## 2020-06-21 DIAGNOSIS — G894 Chronic pain syndrome: Secondary | ICD-10-CM | POA: Diagnosis not present

## 2020-06-21 DIAGNOSIS — Z8781 Personal history of (healed) traumatic fracture: Secondary | ICD-10-CM | POA: Diagnosis not present

## 2020-06-21 DIAGNOSIS — M5136 Other intervertebral disc degeneration, lumbar region: Secondary | ICD-10-CM | POA: Diagnosis not present

## 2020-06-28 DIAGNOSIS — L97911 Non-pressure chronic ulcer of unspecified part of right lower leg limited to breakdown of skin: Secondary | ICD-10-CM | POA: Diagnosis not present

## 2020-07-05 DIAGNOSIS — L97911 Non-pressure chronic ulcer of unspecified part of right lower leg limited to breakdown of skin: Secondary | ICD-10-CM | POA: Diagnosis not present

## 2020-07-13 DIAGNOSIS — M461 Sacroiliitis, not elsewhere classified: Secondary | ICD-10-CM | POA: Diagnosis not present

## 2020-07-15 DIAGNOSIS — M25562 Pain in left knee: Secondary | ICD-10-CM | POA: Diagnosis not present

## 2020-07-19 DIAGNOSIS — M5136 Other intervertebral disc degeneration, lumbar region: Secondary | ICD-10-CM | POA: Diagnosis not present

## 2020-07-19 DIAGNOSIS — K5903 Drug induced constipation: Secondary | ICD-10-CM | POA: Diagnosis not present

## 2020-07-19 DIAGNOSIS — S83282A Other tear of lateral meniscus, current injury, left knee, initial encounter: Secondary | ICD-10-CM | POA: Diagnosis not present

## 2020-07-19 DIAGNOSIS — M5416 Radiculopathy, lumbar region: Secondary | ICD-10-CM | POA: Diagnosis not present

## 2020-07-19 DIAGNOSIS — Z8781 Personal history of (healed) traumatic fracture: Secondary | ICD-10-CM | POA: Diagnosis not present

## 2020-07-19 DIAGNOSIS — M48061 Spinal stenosis, lumbar region without neurogenic claudication: Secondary | ICD-10-CM | POA: Diagnosis not present

## 2020-07-19 DIAGNOSIS — M47816 Spondylosis without myelopathy or radiculopathy, lumbar region: Secondary | ICD-10-CM | POA: Diagnosis not present

## 2020-07-19 DIAGNOSIS — M533 Sacrococcygeal disorders, not elsewhere classified: Secondary | ICD-10-CM | POA: Diagnosis not present

## 2020-07-26 DIAGNOSIS — L97911 Non-pressure chronic ulcer of unspecified part of right lower leg limited to breakdown of skin: Secondary | ICD-10-CM | POA: Diagnosis not present

## 2020-07-28 DIAGNOSIS — E785 Hyperlipidemia, unspecified: Secondary | ICD-10-CM | POA: Diagnosis not present

## 2020-07-28 DIAGNOSIS — I10 Essential (primary) hypertension: Secondary | ICD-10-CM | POA: Diagnosis not present

## 2020-08-01 DIAGNOSIS — L97911 Non-pressure chronic ulcer of unspecified part of right lower leg limited to breakdown of skin: Secondary | ICD-10-CM | POA: Diagnosis not present

## 2020-08-02 DIAGNOSIS — G43909 Migraine, unspecified, not intractable, without status migrainosus: Secondary | ICD-10-CM | POA: Diagnosis not present

## 2020-08-02 DIAGNOSIS — K219 Gastro-esophageal reflux disease without esophagitis: Secondary | ICD-10-CM | POA: Diagnosis not present

## 2020-08-02 DIAGNOSIS — J301 Allergic rhinitis due to pollen: Secondary | ICD-10-CM | POA: Diagnosis not present

## 2020-08-08 DIAGNOSIS — M461 Sacroiliitis, not elsewhere classified: Secondary | ICD-10-CM | POA: Diagnosis not present

## 2020-08-08 DIAGNOSIS — M5136 Other intervertebral disc degeneration, lumbar region: Secondary | ICD-10-CM | POA: Diagnosis not present

## 2020-08-08 DIAGNOSIS — K5903 Drug induced constipation: Secondary | ICD-10-CM | POA: Diagnosis not present

## 2020-08-08 DIAGNOSIS — M5416 Radiculopathy, lumbar region: Secondary | ICD-10-CM | POA: Diagnosis not present

## 2020-08-08 DIAGNOSIS — M48061 Spinal stenosis, lumbar region without neurogenic claudication: Secondary | ICD-10-CM | POA: Diagnosis not present

## 2020-08-08 DIAGNOSIS — M533 Sacrococcygeal disorders, not elsewhere classified: Secondary | ICD-10-CM | POA: Diagnosis not present

## 2020-08-08 DIAGNOSIS — S83282A Other tear of lateral meniscus, current injury, left knee, initial encounter: Secondary | ICD-10-CM | POA: Diagnosis not present

## 2020-08-08 DIAGNOSIS — M47816 Spondylosis without myelopathy or radiculopathy, lumbar region: Secondary | ICD-10-CM | POA: Diagnosis not present

## 2020-08-08 DIAGNOSIS — Z8781 Personal history of (healed) traumatic fracture: Secondary | ICD-10-CM | POA: Diagnosis not present

## 2020-08-09 DIAGNOSIS — L97911 Non-pressure chronic ulcer of unspecified part of right lower leg limited to breakdown of skin: Secondary | ICD-10-CM | POA: Diagnosis not present

## 2020-08-15 DIAGNOSIS — H52201 Unspecified astigmatism, right eye: Secondary | ICD-10-CM | POA: Diagnosis not present

## 2020-08-15 DIAGNOSIS — H2511 Age-related nuclear cataract, right eye: Secondary | ICD-10-CM | POA: Diagnosis not present

## 2020-08-16 DIAGNOSIS — H2512 Age-related nuclear cataract, left eye: Secondary | ICD-10-CM | POA: Diagnosis not present

## 2020-08-18 DIAGNOSIS — L97911 Non-pressure chronic ulcer of unspecified part of right lower leg limited to breakdown of skin: Secondary | ICD-10-CM | POA: Diagnosis not present

## 2020-08-18 DIAGNOSIS — L02415 Cutaneous abscess of right lower limb: Secondary | ICD-10-CM | POA: Diagnosis not present

## 2020-08-18 DIAGNOSIS — L299 Pruritus, unspecified: Secondary | ICD-10-CM | POA: Diagnosis not present

## 2020-08-25 DIAGNOSIS — L97911 Non-pressure chronic ulcer of unspecified part of right lower leg limited to breakdown of skin: Secondary | ICD-10-CM | POA: Diagnosis not present

## 2020-08-29 DIAGNOSIS — H52202 Unspecified astigmatism, left eye: Secondary | ICD-10-CM | POA: Diagnosis not present

## 2020-08-29 DIAGNOSIS — H2512 Age-related nuclear cataract, left eye: Secondary | ICD-10-CM | POA: Diagnosis not present

## 2020-09-07 DIAGNOSIS — M5416 Radiculopathy, lumbar region: Secondary | ICD-10-CM | POA: Diagnosis not present

## 2020-09-07 DIAGNOSIS — M47816 Spondylosis without myelopathy or radiculopathy, lumbar region: Secondary | ICD-10-CM | POA: Diagnosis not present

## 2020-09-07 DIAGNOSIS — M48061 Spinal stenosis, lumbar region without neurogenic claudication: Secondary | ICD-10-CM | POA: Diagnosis not present

## 2020-09-07 DIAGNOSIS — S83282A Other tear of lateral meniscus, current injury, left knee, initial encounter: Secondary | ICD-10-CM | POA: Diagnosis not present

## 2020-09-07 DIAGNOSIS — Z8781 Personal history of (healed) traumatic fracture: Secondary | ICD-10-CM | POA: Diagnosis not present

## 2020-09-07 DIAGNOSIS — L97911 Non-pressure chronic ulcer of unspecified part of right lower leg limited to breakdown of skin: Secondary | ICD-10-CM | POA: Diagnosis not present

## 2020-09-07 DIAGNOSIS — M5136 Other intervertebral disc degeneration, lumbar region: Secondary | ICD-10-CM | POA: Diagnosis not present

## 2020-09-07 DIAGNOSIS — K5903 Drug induced constipation: Secondary | ICD-10-CM | POA: Diagnosis not present

## 2020-09-07 DIAGNOSIS — M533 Sacrococcygeal disorders, not elsewhere classified: Secondary | ICD-10-CM | POA: Diagnosis not present

## 2020-09-08 DIAGNOSIS — M25531 Pain in right wrist: Secondary | ICD-10-CM | POA: Diagnosis not present

## 2020-09-08 DIAGNOSIS — G8929 Other chronic pain: Secondary | ICD-10-CM | POA: Diagnosis not present

## 2020-09-21 DIAGNOSIS — L97911 Non-pressure chronic ulcer of unspecified part of right lower leg limited to breakdown of skin: Secondary | ICD-10-CM | POA: Diagnosis not present

## 2020-09-28 DIAGNOSIS — L97911 Non-pressure chronic ulcer of unspecified part of right lower leg limited to breakdown of skin: Secondary | ICD-10-CM | POA: Diagnosis not present

## 2020-10-03 DIAGNOSIS — L97911 Non-pressure chronic ulcer of unspecified part of right lower leg limited to breakdown of skin: Secondary | ICD-10-CM | POA: Diagnosis not present

## 2020-10-04 DIAGNOSIS — W19XXXA Unspecified fall, initial encounter: Secondary | ICD-10-CM | POA: Diagnosis not present

## 2020-10-04 DIAGNOSIS — Y92009 Unspecified place in unspecified non-institutional (private) residence as the place of occurrence of the external cause: Secondary | ICD-10-CM | POA: Diagnosis not present

## 2020-10-04 DIAGNOSIS — M25552 Pain in left hip: Secondary | ICD-10-CM | POA: Diagnosis not present

## 2020-10-05 DIAGNOSIS — Z8781 Personal history of (healed) traumatic fracture: Secondary | ICD-10-CM | POA: Diagnosis not present

## 2020-10-05 DIAGNOSIS — M5136 Other intervertebral disc degeneration, lumbar region: Secondary | ICD-10-CM | POA: Diagnosis not present

## 2020-10-05 DIAGNOSIS — M5416 Radiculopathy, lumbar region: Secondary | ICD-10-CM | POA: Diagnosis not present

## 2020-10-05 DIAGNOSIS — M533 Sacrococcygeal disorders, not elsewhere classified: Secondary | ICD-10-CM | POA: Diagnosis not present

## 2020-10-05 DIAGNOSIS — S83282A Other tear of lateral meniscus, current injury, left knee, initial encounter: Secondary | ICD-10-CM | POA: Diagnosis not present

## 2020-10-05 DIAGNOSIS — M47816 Spondylosis without myelopathy or radiculopathy, lumbar region: Secondary | ICD-10-CM | POA: Diagnosis not present

## 2020-10-05 DIAGNOSIS — K5903 Drug induced constipation: Secondary | ICD-10-CM | POA: Diagnosis not present

## 2020-10-05 DIAGNOSIS — M48061 Spinal stenosis, lumbar region without neurogenic claudication: Secondary | ICD-10-CM | POA: Diagnosis not present

## 2020-10-06 DIAGNOSIS — M1612 Unilateral primary osteoarthritis, left hip: Secondary | ICD-10-CM | POA: Diagnosis not present

## 2020-10-06 DIAGNOSIS — S32592A Other specified fracture of left pubis, initial encounter for closed fracture: Secondary | ICD-10-CM | POA: Diagnosis not present

## 2020-10-06 DIAGNOSIS — M25552 Pain in left hip: Secondary | ICD-10-CM | POA: Diagnosis not present

## 2020-10-06 DIAGNOSIS — S32402A Unspecified fracture of left acetabulum, initial encounter for closed fracture: Secondary | ICD-10-CM | POA: Diagnosis not present

## 2020-10-10 DIAGNOSIS — R52 Pain, unspecified: Secondary | ICD-10-CM | POA: Diagnosis not present

## 2020-10-10 DIAGNOSIS — Z9181 History of falling: Secondary | ICD-10-CM | POA: Diagnosis not present

## 2020-10-10 DIAGNOSIS — S32599A Other specified fracture of unspecified pubis, initial encounter for closed fracture: Secondary | ICD-10-CM | POA: Diagnosis not present

## 2020-10-10 DIAGNOSIS — M25552 Pain in left hip: Secondary | ICD-10-CM | POA: Diagnosis not present

## 2020-10-10 DIAGNOSIS — Z7401 Bed confinement status: Secondary | ICD-10-CM | POA: Diagnosis not present

## 2020-10-10 DIAGNOSIS — I10 Essential (primary) hypertension: Secondary | ICD-10-CM | POA: Diagnosis not present

## 2020-10-10 DIAGNOSIS — S32512A Fracture of superior rim of left pubis, initial encounter for closed fracture: Secondary | ICD-10-CM | POA: Diagnosis not present

## 2020-10-10 DIAGNOSIS — M545 Low back pain, unspecified: Secondary | ICD-10-CM | POA: Diagnosis not present

## 2020-10-10 DIAGNOSIS — S32502D Unspecified fracture of left pubis, subsequent encounter for fracture with routine healing: Secondary | ICD-10-CM | POA: Diagnosis not present

## 2020-10-10 DIAGNOSIS — S32415D Nondisplaced fracture of anterior wall of left acetabulum, subsequent encounter for fracture with routine healing: Secondary | ICD-10-CM | POA: Diagnosis not present

## 2020-10-10 DIAGNOSIS — R69 Illness, unspecified: Secondary | ICD-10-CM | POA: Diagnosis not present

## 2020-10-10 DIAGNOSIS — J449 Chronic obstructive pulmonary disease, unspecified: Secondary | ICD-10-CM | POA: Diagnosis not present

## 2020-10-10 DIAGNOSIS — R188 Other ascites: Secondary | ICD-10-CM | POA: Diagnosis not present

## 2020-10-10 DIAGNOSIS — R296 Repeated falls: Secondary | ICD-10-CM | POA: Diagnosis not present

## 2020-10-10 DIAGNOSIS — F17210 Nicotine dependence, cigarettes, uncomplicated: Secondary | ICD-10-CM | POA: Diagnosis not present

## 2020-10-10 DIAGNOSIS — Z880 Allergy status to penicillin: Secondary | ICD-10-CM | POA: Diagnosis not present

## 2020-10-10 DIAGNOSIS — J439 Emphysema, unspecified: Secondary | ICD-10-CM | POA: Diagnosis not present

## 2020-10-10 DIAGNOSIS — Z79899 Other long term (current) drug therapy: Secondary | ICD-10-CM | POA: Diagnosis not present

## 2020-10-10 DIAGNOSIS — R29898 Other symptoms and signs involving the musculoskeletal system: Secondary | ICD-10-CM | POA: Diagnosis not present

## 2020-10-10 DIAGNOSIS — S32415A Nondisplaced fracture of anterior wall of left acetabulum, initial encounter for closed fracture: Secondary | ICD-10-CM | POA: Diagnosis not present

## 2020-10-10 DIAGNOSIS — R21 Rash and other nonspecific skin eruption: Secondary | ICD-10-CM | POA: Diagnosis not present

## 2020-10-10 DIAGNOSIS — Z736 Limitation of activities due to disability: Secondary | ICD-10-CM | POA: Diagnosis not present

## 2020-10-10 DIAGNOSIS — S3210XA Unspecified fracture of sacrum, initial encounter for closed fracture: Secondary | ICD-10-CM | POA: Diagnosis not present

## 2020-10-10 DIAGNOSIS — L89152 Pressure ulcer of sacral region, stage 2: Secondary | ICD-10-CM | POA: Diagnosis not present

## 2020-10-10 DIAGNOSIS — K219 Gastro-esophageal reflux disease without esophagitis: Secondary | ICD-10-CM | POA: Diagnosis not present

## 2020-10-10 DIAGNOSIS — G43909 Migraine, unspecified, not intractable, without status migrainosus: Secondary | ICD-10-CM | POA: Diagnosis not present

## 2020-10-10 DIAGNOSIS — G894 Chronic pain syndrome: Secondary | ICD-10-CM | POA: Diagnosis not present

## 2020-10-10 DIAGNOSIS — N3281 Overactive bladder: Secondary | ICD-10-CM | POA: Diagnosis not present

## 2020-10-10 DIAGNOSIS — N200 Calculus of kidney: Secondary | ICD-10-CM | POA: Diagnosis not present

## 2020-10-10 DIAGNOSIS — Z881 Allergy status to other antibiotic agents status: Secondary | ICD-10-CM | POA: Diagnosis not present

## 2020-10-10 DIAGNOSIS — S32402A Unspecified fracture of left acetabulum, initial encounter for closed fracture: Secondary | ICD-10-CM | POA: Diagnosis not present

## 2020-10-10 DIAGNOSIS — S3219XA Other fracture of sacrum, initial encounter for closed fracture: Secondary | ICD-10-CM | POA: Diagnosis not present

## 2020-10-10 DIAGNOSIS — S32009A Unspecified fracture of unspecified lumbar vertebra, initial encounter for closed fracture: Secondary | ICD-10-CM | POA: Diagnosis not present

## 2020-10-10 DIAGNOSIS — Z743 Need for continuous supervision: Secondary | ICD-10-CM | POA: Diagnosis not present

## 2020-10-10 DIAGNOSIS — S32592A Other specified fracture of left pubis, initial encounter for closed fracture: Secondary | ICD-10-CM | POA: Diagnosis not present

## 2020-10-10 DIAGNOSIS — S32059A Unspecified fracture of fifth lumbar vertebra, initial encounter for closed fracture: Secondary | ICD-10-CM | POA: Diagnosis not present

## 2020-10-10 DIAGNOSIS — R2689 Other abnormalities of gait and mobility: Secondary | ICD-10-CM | POA: Diagnosis not present

## 2020-10-10 DIAGNOSIS — M6281 Muscle weakness (generalized): Secondary | ICD-10-CM | POA: Diagnosis not present

## 2020-10-10 DIAGNOSIS — E7800 Pure hypercholesterolemia, unspecified: Secondary | ICD-10-CM | POA: Diagnosis not present

## 2020-10-14 DIAGNOSIS — N3281 Overactive bladder: Secondary | ICD-10-CM | POA: Diagnosis not present

## 2020-10-14 DIAGNOSIS — R2689 Other abnormalities of gait and mobility: Secondary | ICD-10-CM | POA: Insufficient documentation

## 2020-10-14 DIAGNOSIS — S32415D Nondisplaced fracture of anterior wall of left acetabulum, subsequent encounter for fracture with routine healing: Secondary | ICD-10-CM | POA: Diagnosis not present

## 2020-10-14 DIAGNOSIS — S32059A Unspecified fracture of fifth lumbar vertebra, initial encounter for closed fracture: Secondary | ICD-10-CM | POA: Diagnosis not present

## 2020-10-14 DIAGNOSIS — Z743 Need for continuous supervision: Secondary | ICD-10-CM | POA: Diagnosis not present

## 2020-10-14 DIAGNOSIS — K219 Gastro-esophageal reflux disease without esophagitis: Secondary | ICD-10-CM | POA: Diagnosis not present

## 2020-10-14 DIAGNOSIS — S32592A Other specified fracture of left pubis, initial encounter for closed fracture: Secondary | ICD-10-CM | POA: Diagnosis not present

## 2020-10-14 DIAGNOSIS — G43909 Migraine, unspecified, not intractable, without status migrainosus: Secondary | ICD-10-CM | POA: Diagnosis not present

## 2020-10-14 DIAGNOSIS — M6281 Muscle weakness (generalized): Secondary | ICD-10-CM | POA: Insufficient documentation

## 2020-10-14 DIAGNOSIS — R140 Abdominal distension (gaseous): Secondary | ICD-10-CM | POA: Diagnosis not present

## 2020-10-14 DIAGNOSIS — Z736 Limitation of activities due to disability: Secondary | ICD-10-CM | POA: Diagnosis not present

## 2020-10-14 DIAGNOSIS — R296 Repeated falls: Secondary | ICD-10-CM | POA: Diagnosis not present

## 2020-10-14 DIAGNOSIS — R262 Difficulty in walking, not elsewhere classified: Secondary | ICD-10-CM | POA: Diagnosis not present

## 2020-10-14 DIAGNOSIS — S32502D Unspecified fracture of left pubis, subsequent encounter for fracture with routine healing: Secondary | ICD-10-CM | POA: Diagnosis not present

## 2020-10-14 DIAGNOSIS — L89152 Pressure ulcer of sacral region, stage 2: Secondary | ICD-10-CM | POA: Diagnosis not present

## 2020-10-14 DIAGNOSIS — R69 Illness, unspecified: Secondary | ICD-10-CM | POA: Diagnosis not present

## 2020-10-14 DIAGNOSIS — Z9181 History of falling: Secondary | ICD-10-CM | POA: Diagnosis not present

## 2020-10-14 DIAGNOSIS — I10 Essential (primary) hypertension: Secondary | ICD-10-CM | POA: Diagnosis not present

## 2020-10-14 DIAGNOSIS — J449 Chronic obstructive pulmonary disease, unspecified: Secondary | ICD-10-CM | POA: Diagnosis not present

## 2020-10-14 DIAGNOSIS — Z7401 Bed confinement status: Secondary | ICD-10-CM | POA: Diagnosis not present

## 2020-10-14 DIAGNOSIS — S32415A Nondisplaced fracture of anterior wall of left acetabulum, initial encounter for closed fracture: Secondary | ICD-10-CM | POA: Diagnosis not present

## 2020-10-15 DIAGNOSIS — R140 Abdominal distension (gaseous): Secondary | ICD-10-CM | POA: Diagnosis not present

## 2020-10-15 DIAGNOSIS — S32415D Nondisplaced fracture of anterior wall of left acetabulum, subsequent encounter for fracture with routine healing: Secondary | ICD-10-CM | POA: Insufficient documentation

## 2020-10-17 DIAGNOSIS — I10 Essential (primary) hypertension: Secondary | ICD-10-CM | POA: Diagnosis not present

## 2020-10-17 DIAGNOSIS — G43909 Migraine, unspecified, not intractable, without status migrainosus: Secondary | ICD-10-CM | POA: Diagnosis not present

## 2020-10-17 DIAGNOSIS — S32415D Nondisplaced fracture of anterior wall of left acetabulum, subsequent encounter for fracture with routine healing: Secondary | ICD-10-CM | POA: Diagnosis not present

## 2020-10-17 DIAGNOSIS — Z9181 History of falling: Secondary | ICD-10-CM | POA: Diagnosis not present

## 2020-10-21 DIAGNOSIS — Z9181 History of falling: Secondary | ICD-10-CM | POA: Diagnosis not present

## 2020-10-21 DIAGNOSIS — S32415D Nondisplaced fracture of anterior wall of left acetabulum, subsequent encounter for fracture with routine healing: Secondary | ICD-10-CM | POA: Diagnosis not present

## 2020-10-25 DIAGNOSIS — Z9181 History of falling: Secondary | ICD-10-CM | POA: Diagnosis not present

## 2020-10-25 DIAGNOSIS — G43909 Migraine, unspecified, not intractable, without status migrainosus: Secondary | ICD-10-CM | POA: Diagnosis not present

## 2020-10-25 DIAGNOSIS — S32415D Nondisplaced fracture of anterior wall of left acetabulum, subsequent encounter for fracture with routine healing: Secondary | ICD-10-CM | POA: Diagnosis not present

## 2020-10-27 DIAGNOSIS — G43909 Migraine, unspecified, not intractable, without status migrainosus: Secondary | ICD-10-CM | POA: Diagnosis not present

## 2020-10-27 DIAGNOSIS — Z9181 History of falling: Secondary | ICD-10-CM | POA: Diagnosis not present

## 2020-10-27 DIAGNOSIS — S32415D Nondisplaced fracture of anterior wall of left acetabulum, subsequent encounter for fracture with routine healing: Secondary | ICD-10-CM | POA: Diagnosis not present

## 2020-10-29 DIAGNOSIS — G8929 Other chronic pain: Secondary | ICD-10-CM | POA: Diagnosis not present

## 2020-10-29 DIAGNOSIS — S32502D Unspecified fracture of left pubis, subsequent encounter for fracture with routine healing: Secondary | ICD-10-CM | POA: Diagnosis not present

## 2020-10-29 DIAGNOSIS — M545 Low back pain, unspecified: Secondary | ICD-10-CM | POA: Diagnosis not present

## 2020-10-29 DIAGNOSIS — G43909 Migraine, unspecified, not intractable, without status migrainosus: Secondary | ICD-10-CM | POA: Diagnosis not present

## 2020-10-29 DIAGNOSIS — S32415D Nondisplaced fracture of anterior wall of left acetabulum, subsequent encounter for fracture with routine healing: Secondary | ICD-10-CM | POA: Diagnosis not present

## 2020-10-31 DIAGNOSIS — Z9181 History of falling: Secondary | ICD-10-CM | POA: Diagnosis not present

## 2020-10-31 DIAGNOSIS — S32415D Nondisplaced fracture of anterior wall of left acetabulum, subsequent encounter for fracture with routine healing: Secondary | ICD-10-CM | POA: Diagnosis not present

## 2020-10-31 DIAGNOSIS — G43909 Migraine, unspecified, not intractable, without status migrainosus: Secondary | ICD-10-CM | POA: Diagnosis not present

## 2020-11-01 DIAGNOSIS — M545 Low back pain, unspecified: Secondary | ICD-10-CM | POA: Diagnosis not present

## 2020-11-01 DIAGNOSIS — G43909 Migraine, unspecified, not intractable, without status migrainosus: Secondary | ICD-10-CM | POA: Diagnosis not present

## 2020-11-01 DIAGNOSIS — G8929 Other chronic pain: Secondary | ICD-10-CM | POA: Diagnosis not present

## 2020-11-01 DIAGNOSIS — S32502D Unspecified fracture of left pubis, subsequent encounter for fracture with routine healing: Secondary | ICD-10-CM | POA: Diagnosis not present

## 2020-11-01 DIAGNOSIS — S32415D Nondisplaced fracture of anterior wall of left acetabulum, subsequent encounter for fracture with routine healing: Secondary | ICD-10-CM | POA: Diagnosis not present

## 2020-11-02 ENCOUNTER — Emergency Department (HOSPITAL_COMMUNITY): Payer: Medicare Other

## 2020-11-02 ENCOUNTER — Encounter (HOSPITAL_COMMUNITY): Payer: Self-pay | Admitting: Emergency Medicine

## 2020-11-02 ENCOUNTER — Emergency Department (HOSPITAL_COMMUNITY)
Admission: EM | Admit: 2020-11-02 | Discharge: 2020-11-03 | Disposition: A | Discharge status: Home / Self Care | Payer: Medicare Other | Attending: Emergency Medicine | Admitting: Emergency Medicine

## 2020-11-02 DIAGNOSIS — S0101XA Laceration without foreign body of scalp, initial encounter: Secondary | ICD-10-CM | POA: Insufficient documentation

## 2020-11-02 DIAGNOSIS — I10 Essential (primary) hypertension: Secondary | ICD-10-CM | POA: Diagnosis not present

## 2020-11-02 DIAGNOSIS — R609 Edema, unspecified: Secondary | ICD-10-CM | POA: Diagnosis not present

## 2020-11-02 DIAGNOSIS — W050XXA Fall from non-moving wheelchair, initial encounter: Secondary | ICD-10-CM | POA: Insufficient documentation

## 2020-11-02 DIAGNOSIS — Z79899 Other long term (current) drug therapy: Secondary | ICD-10-CM | POA: Insufficient documentation

## 2020-11-02 DIAGNOSIS — R000 Tachycardia, unspecified: Secondary | ICD-10-CM | POA: Diagnosis not present

## 2020-11-02 DIAGNOSIS — S0990XA Unspecified injury of head, initial encounter: Secondary | ICD-10-CM

## 2020-11-02 DIAGNOSIS — Z043 Encounter for examination and observation following other accident: Secondary | ICD-10-CM | POA: Diagnosis not present

## 2020-11-02 DIAGNOSIS — R52 Pain, unspecified: Secondary | ICD-10-CM | POA: Diagnosis not present

## 2020-11-02 DIAGNOSIS — S32020A Wedge compression fracture of second lumbar vertebra, initial encounter for closed fracture: Secondary | ICD-10-CM | POA: Diagnosis not present

## 2020-11-02 DIAGNOSIS — S32502A Unspecified fracture of left pubis, initial encounter for closed fracture: Secondary | ICD-10-CM | POA: Diagnosis not present

## 2020-11-02 DIAGNOSIS — F17210 Nicotine dependence, cigarettes, uncomplicated: Secondary | ICD-10-CM | POA: Insufficient documentation

## 2020-11-02 DIAGNOSIS — R0902 Hypoxemia: Secondary | ICD-10-CM | POA: Diagnosis not present

## 2020-11-02 DIAGNOSIS — W19XXXA Unspecified fall, initial encounter: Secondary | ICD-10-CM | POA: Diagnosis not present

## 2020-11-02 DIAGNOSIS — G319 Degenerative disease of nervous system, unspecified: Secondary | ICD-10-CM | POA: Diagnosis not present

## 2020-11-02 DIAGNOSIS — M47812 Spondylosis without myelopathy or radiculopathy, cervical region: Secondary | ICD-10-CM | POA: Diagnosis not present

## 2020-11-02 NOTE — ED Notes (Signed)
No loc from fall

## 2020-11-02 NOTE — ED Notes (Signed)
PT REMOVED C COLLAR WHILE WAITING TO BE SEEN

## 2020-11-02 NOTE — ED Provider Notes (Signed)
Emergency Medicine Provider Triage Evaluation Note  [name] , a 55 y.o. female  was evaluated in triage.  Pt complains of fall.  Patient endorses pain to posterior head, neck pain, bilateral lumbar back pain, bilateral hip pain.  Patient reports that approximately 1200 today per wheelchair rolled and tipped backwards.  Patient struck the back of her head on the ground.  Patient denies any loss of consciousness.  Patient reports that she had low back pain bilateral hip pain prior to fall however this pain has been worse since her injury.  Patient is not on blood thinners.  Review of Systems  Positive: Neck pain, lumbar back pain, bilateral hip pain Negative: Syncope, numbness, weakness, asymmetry, slurred speech, headache, nausea, vomiting, visual disturbance.  Physical Exam  BP 135/82   Pulse (!) 106   Temp 98.3 F (36.8 C)   Resp 16   SpO2 99%  Gen:   Awake, no distress   Resp:  Normal effort  MSK:   Moves extremities without difficulty, no shortening or rotation noted to bilateral lower legs. Other:  Patient has midline tenderness of the cervical spine.  Patient has tenderness to bilateral lumbar paraspinous chest.  She has no deformity noted to cervical, thoracic, or lumbar spine.  CN II through XII intact  Medical Decision Making  Medically screening exam initiated at 4:10 PM.  Appropriate orders placed.  ORVILLE WIDMANN was informed that the remainder of the evaluation will be completed by another provider, this initial triage assessment does not replace that evaluation, and the importance of remaining in the ED until their evaluation is complete.  The patient appears stable so that the remainder of the work up may be completed by another provider.      Loni Beckwith, PA-C 11/02/20 1614    Charlesetta Shanks, MD 11/03/20 [phone]

## 2020-11-02 NOTE — ED Triage Notes (Signed)
Pt here from home with c/o fall hit head no thinners , 100 mcg fentanyl  given by ems

## 2020-11-03 MED ORDER — OXYCODONE HCL 5 MG PO TABS
5.0000 mg | ORAL_TABLET | Freq: Once | ORAL | Status: AC
  Administered 2020-11-03: 5 mg via ORAL
  Filled 2020-11-03: qty 1

## 2020-11-03 NOTE — ED Provider Notes (Signed)
[facility] EMERGENCY DEPARTMENT Provider Note  CSN: [id number] Arrival date & time: 11/02/20 1433  Chief Complaint(s) No chief complaint on file.  HPI [name] is a 55 y.o. female with a history of recurrent frequent falls with prior TBI and subdural hematoma who recently fell and had pelvic fractures in June requiring her to be wheelchair-bound here after mechanical fall while in a wheelchair.  She reports that they have placed ramps to get through doorways.  While trying to wheel herself inside the house, her wheelchair tilted backwards resulting in her hitting her head on the floor.  She denied any loss of consciousness.  She is not on any blood thinners.  Denied any other injuries.    HPI  Past Medical History Past Medical History:  Diagnosis Date   Anxiety    Arthritis    Depression    GERD (gastroesophageal reflux disease)    Headache    migraines   Hypertension    Migraines    Subdural hematoma (HCC)    Traumatic brain injury [facility])    Patient Active Problem List   Diagnosis Date Noted   Bile duct abnormality    Home Medication(s) Prior to Admission medications   Medication Sig Start Date End Date Taking? Authorizing Provider  alendronate (FOSAMAX) 70 MG tablet Take 70 mg by mouth once a week. Take with a full glass of water on an empty stomach.  Tuesdays    [provider]  azelastine (ASTELIN) 0.1 % nasal spray USE 2 SPRAYS IN EACH NOSTRIL TWICE (2) DAILY AS DIRECTED 06/06/18   Padgett, Rae Halsted, MD  butalbital-acetaminophen-caffeine (FIORICET, ESGIC) [phone] MG tablet Take 1 tablet by mouth every 4 (four) hours as needed for migraine or headache. 12/23/17   [provider]  Calcium Carb-Cholecalciferol (CALCIUM+D3) 600-800 MG-UNIT TABS Take 1 tablet by mouth 2 (two) times daily.    [provider]  Carbinoxamine Maleate (RYVENT) 6 MG TABS Take 1 tablet by mouth 2 (two) times daily. 01/07/18   Kennith Gain, MD  EMBEDA 80-3.2 MG CPCR Take 1 capsule by mouth daily. 05/07/17   [provider]  fluticasone Asencion Islam) 50 MCG/ACT nasal spray  01/06/18   [provider]  gabapentin (NEURONTIN) 800 MG tablet Take 800 mg by mouth 3 (three) times daily.    [provider]  levocetirizine (XYZAL) 5 MG tablet Take 5 mg by mouth every evening. 12/05/17   [provider]  lidocaine (LMX) 4 % cream Apply 1 application topically 3 (three) times daily as needed (back pain).    [provider]  lisinopril (PRINIVIL,ZESTRIL) 10 MG tablet Take 10 mg by mouth daily.    [provider]  meloxicam (MOBIC) 7.5 MG tablet Take 7.5 mg by mouth daily.    [provider]  morphine (MSIR) 15 MG tablet Take 15 mg by mouth 2 (two) times daily.    [provider]  Multiple Vitamin (MULTIVITAMIN) capsule Take 1 capsule by mouth daily.    [provider]  Omega-3 Fatty Acids (FISH OIL) 1000 MG CAPS Take 1,000 mg by mouth daily.    [provider]  omeprazole (PRILOSEC) 40 MG capsule Take 40 mg by mouth 2 (two) times daily.    [provider]  ondansetron (ZOFRAN-ODT) 4 MG disintegrating tablet Take 4 mg by mouth every 8 (eight) hours as needed for nausea or vomiting.    [provider]  oxybutynin (DITROPAN-XL) 10 MG 24 hr tablet  Take 10 mg by mouth daily. 05/06/17   [provider]  phenylephrine (SUDAFED PE) 10 MG TABS tablet Take by mouth.    [provider]  Polyethylene Glycol 3350 (PEG 3350) POWD  07/21/12   [provider]  promethazine (PHENERGAN) 25 MG tablet Take 25 mg by mouth every 6 (six) hours as needed for nausea or vomiting.    [provider]  propranolol (INDERAL) 10 MG tablet Take 10 mg by mouth 3 (three) times daily.    [provider]  SUMAtriptan (IMITREX) 100 MG tablet Take 100 mg by mouth every 2 (two) hours as needed for migraine. May repeat in 2 hours if  headache persists or recurs.    [provider]  SYMPROIC 0.2 MG TABS Take 0.2 mg by mouth as needed. 12/27/17   [provider]  topiramate (TOPAMAX) 100 MG tablet Take 1 tablet by mouth 2 (two) times daily. 03/04/17   [provider]  triamcinolone (KENALOG) 0.1 % paste Use as directed 1 application in the mouth or throat 4 (four) times daily. Mouth ulcer 05/14/17   [provider]                                                                                                                                    Past Surgical History Past Surgical History:  Procedure Laterality Date   BRAIN SURGERY     2012 for brain bleed   BREAST ENHANCEMENT SURGERY     EUS N/A 06/06/2017   Procedure: UPPER ENDOSCOPIC ULTRASOUND (EUS) RADIAL;  Surgeon: Milus Banister, MD;  Location: [facility] END[facility];  Service: Endoscopy;  Laterality: N/A;   Family History Family History  Problem Relation Age of Onset   Cancer Mother        Spindle Cell   Prostate cancer Father     Social History Social History   Tobacco Use   Smoking status: Every Day    Packs/day: 0.75    Years: 13.00    Pack years: 9.75    Types: Cigarettes   Smokeless tobacco: Never  Vaping Use   Vaping Use: Never used  Substance Use Topics   Alcohol use: No    Comment: none in 4-5 years   Drug use: No   Allergies Penicillins and Bactrim [sulfamethoxazole-trimethoprim]  Review of Systems Review of Systems All other systems are reviewed and are negative for acute change except as noted in the HPI  Physical Exam Vital Signs  I have reviewed the triage vital signs BP 140/82 (BP Location: Left Arm)   Pulse 90   Temp 98.5 F (36.9 C) (Oral)   Resp 16   SpO2 99%   Physical Exam Vitals reviewed.  Constitutional:      General: She is not in acute distress.    Appearance: She is well-developed. She is not diaphoretic.  HENT:     Head: Normocephalic. Contusion and laceration  present.      Right  Ear: External ear normal.     Left Ear: External ear normal.     Nose: Nose normal.  Eyes:     General: No scleral icterus.    Conjunctiva/sclera: Conjunctivae normal.  Neck:     Trachea: Phonation normal.  Cardiovascular:     Rate and Rhythm: Normal rate and regular rhythm.  Pulmonary:     Effort: Pulmonary effort is normal. No respiratory distress.     Breath sounds: No stridor.  Abdominal:     General: There is no distension.  Musculoskeletal:        General: Normal range of motion.     Cervical back: Normal range of motion.  Neurological:     Mental Status: She is alert and oriented to person, place, and time.  Psychiatric:        Behavior: Behavior normal.    ED Results and Treatments Labs (all labs ordered are listed, but only abnormal results are displayed) Labs Reviewed - No data to display                                                                                                                       EKG  EKG Interpretation  Date/Time:    Ventricular Rate:    PR Interval:    QRS Duration:   QT Interval:    QTC Calculation:   R Axis:     Text Interpretation:          Radiology DG Lumbar Spine Complete  Result Date: 11/02/2020 CLINICAL DATA:  Patient in wheelchair, wheelchair fell backwards, increased RIGHT hip and lumbar pain EXAM: LUMBAR SPINE - COMPLETE 4+ VIEW COMPARISON:  CT lumbar spine 10/10/2020 FINDINGS: Osseous demineralization. Five non-rib-bearing lumbar vertebra. Mild dextroconvex thoracolumbar scoliosis. Superior endplate compression fracture of L2 vertebral body with nearly 50% anterior height loss, unchanged. Mild superior endplate compression deformity of L1 vertebral body, with minimal anterior height loss, unchanged. No additional fracture, subluxation, or bone destruction. S2 fracture of sacrum, mildly displaced, unchanged. SI joints preserved. IMPRESSION: Osseous demineralization with chronic superior endplate compression fractures of L2  and to lesser degree L1. Subacute S2 fracture of sacrum. Electronically Signed   By: Lavonia Dana M.D.   On: 11/02/2020 17:23   CT Head Wo Contrast  Result Date: 11/02/2020 CLINICAL DATA:  Fall EXAM: CT HEAD WITHOUT CONTRAST CT CERVICAL SPINE WITHOUT CONTRAST TECHNIQUE: Multidetector CT imaging of the head and cervical spine was performed following the standard protocol without intravenous contrast. Multiplanar CT image reconstructions of the cervical spine were also generated. COMPARISON:  MRI brain 09/29/2019, CT brain 07/14/2018, CT cervical spine 09/29/2011 FINDINGS: CT HEAD FINDINGS Brain: No acute territorial infarction, hemorrhage, or intracranial mass. Stable ventricle size. Mild cortical atrophy. Cerebellar atrophy as before. Vascular: No hyperdense vessels.  No unexpected calcification. Skull: No fracture.  Prior left frontotemporal craniotomy. Sinuses/Orbits: No acute finding. Other: None CT CERVICAL SPINE FINDINGS Alignment: No subluxation.  Facet alignment is  maintained. Skull base and vertebrae: No acute fracture. No primary bone lesion or focal pathologic process. Soft tissues and spinal canal: No prevertebral fluid or swelling. No visible canal hematoma. Disc levels: Mild disc space narrowing C5-C6 and C6-C7. Mild facet degenerative changes Upper chest: Negative. Other: None IMPRESSION: 1. No CT evidence for acute intracranial abnormality. Cortical and cerebellar atrophy. 2. Straightening of the cervical spine. No acute osseous abnormality Electronically Signed   By: Donavan Foil M.D.   On: 11/02/2020 19:26   CT Cervical Spine Wo Contrast  Result Date: 11/02/2020 CLINICAL DATA:  Fall EXAM: CT HEAD WITHOUT CONTRAST CT CERVICAL SPINE WITHOUT CONTRAST TECHNIQUE: Multidetector CT imaging of the head and cervical spine was performed following the standard protocol without intravenous contrast. Multiplanar CT image reconstructions of the cervical spine were also generated. COMPARISON:  MRI brain  09/29/2019, CT brain 07/14/2018, CT cervical spine 09/29/2011 FINDINGS: CT HEAD FINDINGS Brain: No acute territorial infarction, hemorrhage, or intracranial mass. Stable ventricle size. Mild cortical atrophy. Cerebellar atrophy as before. Vascular: No hyperdense vessels.  No unexpected calcification. Skull: No fracture.  Prior left frontotemporal craniotomy. Sinuses/Orbits: No acute finding. Other: None CT CERVICAL SPINE FINDINGS Alignment: No subluxation.  Facet alignment is maintained. Skull base and vertebrae: No acute fracture. No primary bone lesion or focal pathologic process. Soft tissues and spinal canal: No prevertebral fluid or swelling. No visible canal hematoma. Disc levels: Mild disc space narrowing C5-C6 and C6-C7. Mild facet degenerative changes Upper chest: Negative. Other: None IMPRESSION: 1. No CT evidence for acute intracranial abnormality. Cortical and cerebellar atrophy. 2. Straightening of the cervical spine. No acute osseous abnormality Electronically Signed   By: Donavan Foil M.D.   On: 11/02/2020 19:26   DG Hip Unilat With Pelvis 2-3 Views Left  Result Date: 11/02/2020 CLINICAL DATA:  Pelvic fractures, fell again today, wheelchair fell over backwards, increased lumbar RIGHT hip pain, follow-up EXAM: DG HIP (WITH OR WITHOUT PELVIS) 2-3V LEFT COMPARISON:  CT LEFT hip 10/10/2020 FINDINGS: Subacute mildly displaced fractures of the LEFT superior and inferior pubic rami. Proximal LEFT femur intact without fracture or dislocation. Sclerosis in the LEFT sacral ala consistent with healing fracture. Minimal sclerosis at RIGHT pubic body noted. IMPRESSION: Subacute fractures of the LEFT superior and inferior pubic rami as well as LEFT sacrum. Sclerosis in the RIGHT pubic body, question healing fracture. Electronically Signed   By: Lavonia Dana M.D.   On: 11/02/2020 17:27   DG Hip Unilat With Pelvis 2-3 Views Right  Result Date: 11/02/2020 CLINICAL DATA:  We will child fell backwards, increased  RIGHT hip pain EXAM: DG HIP (WITH OR WITHOUT PELVIS) 2-3V RIGHT COMPARISON:  CT LEFT hip 10/10/2020 FINDINGS: Osseous demineralization. Hip and SI joint spaces preserved. Fracture of the LEFT superior and inferior pubic rami, unchanged. Mild sclerosis vertically in the LEFT sacral ala consistent with subacute healing fracture. Increased sclerosis within the RIGHT pubic body favor healing fracture, not completely visualized on the prior CT exam. No additional fracture or dislocation seen. IMPRESSION: Subacute fractures of the LEFT superior and inferior pubic rami as well as LEFT sacral ala. Sclerosis at the RIGHT pubic body with slight cortical deformity, likely representing a subacute fracture. No new abnormalities. Electronically Signed   By: Lavonia Dana M.D.   On: 11/02/2020 17:26    Pertinent labs & imaging results that were available during my care of the patient were reviewed by me and considered in my medical decision making (see chart for details).  Medications Ordered  in ED Medications  oxyCODONE (Oxy IR/ROXICODONE) immediate release tablet 5 mg (5 mg Oral Given 11/03/20 0409)                                                                                                                                    Procedures .Marland KitchenLaceration Repair  Date/Time: 11/03/2020 4:46 AM Performed by: Fatima Blank, MD Authorized by: Fatima Blank, MD   Consent:    Consent obtained:  Verbal   Consent given by:  Patient   Risks discussed:  Need for additional repair, nerve damage, poor wound healing, poor cosmetic result and infection   Alternatives discussed:  Delayed treatment and no treatment Universal protocol:    Procedure explained and questions answered to patient or proxy's satisfaction: yes     Imaging studies available: yes     Patient identity confirmed:  Verbally with patient and arm band Anesthesia:    Anesthesia method:  None Laceration details:    Location:  Scalp   Scalp  location:  Occipital   Length (cm):  1   Depth (mm):  4 Pre-procedure details:    Preparation:  Patient was prepped and draped in usual sterile fashion and imaging obtained to evaluate for foreign bodies Exploration:    Hemostasis achieved with:  Direct pressure Treatment:    Area cleansed with:  Saline   Amount of cleaning:  Standard   Irrigation solution:  Sterile saline   Irrigation volume:  300   Irrigation method:  Pressure wash Skin repair:    Repair method:  Tissue adhesive Approximation:    Approximation:  Close Repair type:    Repair type:  Simple  (including critical care time)  Medical Decision Making / ED Course I have reviewed the nursing notes for this encounter and the patient's prior records (if available in EHR or on provided paperwork).   ELLAGRACE YOSHIDA was evaluated in Emergency Department on 11/03/2020 for the symptoms described in the history of present illness. She was evaluated in the context of the global COVID-19 pandemic, which necessitated consideration that the patient might be at risk for infection with the SARS-CoV-2 virus that causes COVID-19. Institutional protocols and algorithms that pertain to the evaluation of patients at risk for COVID-19 are in a state of rapid change based on information released by regulatory bodies including the CDC and federal and state organizations. These policies and algorithms were followed during the patient's care in the ED.  Minor head trauma from wheelchair. Resulting in scalp laceration. Thoroughly irrigated and closed as above. CT head and cervical spine negative. Plain films of her hips notable for subacute fractures without acute injury. Unsure of tdap status but has PCP appointment in 2 days and will check/get it there.      Final Clinical Impression(s) / ED Diagnoses Final diagnoses:  Fall, initial encounter  Minor head injury, initial encounter  Laceration of scalp, initial encounter  The patient  appears reasonably screened and/or stabilized for discharge and I doubt any other medical condition or other [facility] requiring further screening, evaluation, or treatment in the ED at this time prior to discharge. Safe for discharge with strict return precautions.  Disposition: Discharge  Condition: Good  I have discussed the results, Dx and Tx plan with the patient/family who expressed understanding and agree(s) with the plan. Discharge instructions discussed at length. The patient/family was given strict return precautions who verbalized understanding of the instructions. No further questions at time of discharge.    ED Discharge Orders     None        Follow Up: Townsend Roger, MD [address] [place] [address] [phone]  Go to  to check on your tetanus status and get booster      This chart was dictated using voice recognition software.  Despite best efforts to proofread,  errors can occur which can change the documentation meaning.    Fatima Blank, MD 11/03/20 [id number]

## 2020-11-03 NOTE — ED Notes (Signed)
Patient verbalizes understanding of discharge instructions. Opportunity for questioning and answers were provided. Armband removed by staff, pt discharged from ED via wheelchair.  

## 2020-11-03 NOTE — ED Notes (Signed)
Family at bedside. 

## 2020-11-04 DIAGNOSIS — S32415D Nondisplaced fracture of anterior wall of left acetabulum, subsequent encounter for fracture with routine healing: Secondary | ICD-10-CM | POA: Diagnosis not present

## 2020-11-04 DIAGNOSIS — G8929 Other chronic pain: Secondary | ICD-10-CM | POA: Diagnosis not present

## 2020-11-04 DIAGNOSIS — T8189XA Other complications of procedures, not elsewhere classified, initial encounter: Secondary | ICD-10-CM | POA: Diagnosis not present

## 2020-11-04 DIAGNOSIS — G43909 Migraine, unspecified, not intractable, without status migrainosus: Secondary | ICD-10-CM | POA: Diagnosis not present

## 2020-11-04 DIAGNOSIS — S32502D Unspecified fracture of left pubis, subsequent encounter for fracture with routine healing: Secondary | ICD-10-CM | POA: Diagnosis not present

## 2020-11-04 DIAGNOSIS — M545 Low back pain, unspecified: Secondary | ICD-10-CM | POA: Diagnosis not present

## 2020-11-08 DIAGNOSIS — S83282A Other tear of lateral meniscus, current injury, left knee, initial encounter: Secondary | ICD-10-CM | POA: Diagnosis not present

## 2020-11-08 DIAGNOSIS — M533 Sacrococcygeal disorders, not elsewhere classified: Secondary | ICD-10-CM | POA: Diagnosis not present

## 2020-11-08 DIAGNOSIS — M47816 Spondylosis without myelopathy or radiculopathy, lumbar region: Secondary | ICD-10-CM | POA: Diagnosis not present

## 2020-11-08 DIAGNOSIS — M48061 Spinal stenosis, lumbar region without neurogenic claudication: Secondary | ICD-10-CM | POA: Diagnosis not present

## 2020-11-08 DIAGNOSIS — Z8781 Personal history of (healed) traumatic fracture: Secondary | ICD-10-CM | POA: Diagnosis not present

## 2020-11-08 DIAGNOSIS — Z1389 Encounter for screening for other disorder: Secondary | ICD-10-CM | POA: Diagnosis not present

## 2020-11-08 DIAGNOSIS — K5903 Drug induced constipation: Secondary | ICD-10-CM | POA: Diagnosis not present

## 2020-11-08 DIAGNOSIS — M5136 Other intervertebral disc degeneration, lumbar region: Secondary | ICD-10-CM | POA: Diagnosis not present

## 2020-11-09 DIAGNOSIS — S32502D Unspecified fracture of left pubis, subsequent encounter for fracture with routine healing: Secondary | ICD-10-CM | POA: Diagnosis not present

## 2020-11-09 DIAGNOSIS — G8929 Other chronic pain: Secondary | ICD-10-CM | POA: Diagnosis not present

## 2020-11-09 DIAGNOSIS — M545 Low back pain, unspecified: Secondary | ICD-10-CM | POA: Diagnosis not present

## 2020-11-09 DIAGNOSIS — S32415D Nondisplaced fracture of anterior wall of left acetabulum, subsequent encounter for fracture with routine healing: Secondary | ICD-10-CM | POA: Diagnosis not present

## 2020-11-09 DIAGNOSIS — G43909 Migraine, unspecified, not intractable, without status migrainosus: Secondary | ICD-10-CM | POA: Diagnosis not present

## 2020-11-10 DIAGNOSIS — S32592A Other specified fracture of left pubis, initial encounter for closed fracture: Secondary | ICD-10-CM | POA: Diagnosis not present

## 2020-11-11 DIAGNOSIS — G8929 Other chronic pain: Secondary | ICD-10-CM | POA: Diagnosis not present

## 2020-11-11 DIAGNOSIS — S32415D Nondisplaced fracture of anterior wall of left acetabulum, subsequent encounter for fracture with routine healing: Secondary | ICD-10-CM | POA: Diagnosis not present

## 2020-11-11 DIAGNOSIS — M545 Low back pain, unspecified: Secondary | ICD-10-CM | POA: Diagnosis not present

## 2020-11-11 DIAGNOSIS — S32502D Unspecified fracture of left pubis, subsequent encounter for fracture with routine healing: Secondary | ICD-10-CM | POA: Diagnosis not present

## 2020-11-11 DIAGNOSIS — G43909 Migraine, unspecified, not intractable, without status migrainosus: Secondary | ICD-10-CM | POA: Diagnosis not present

## 2020-11-14 DIAGNOSIS — S32415D Nondisplaced fracture of anterior wall of left acetabulum, subsequent encounter for fracture with routine healing: Secondary | ICD-10-CM | POA: Diagnosis not present

## 2020-11-14 DIAGNOSIS — G43909 Migraine, unspecified, not intractable, without status migrainosus: Secondary | ICD-10-CM | POA: Diagnosis not present

## 2020-11-14 DIAGNOSIS — G8929 Other chronic pain: Secondary | ICD-10-CM | POA: Diagnosis not present

## 2020-11-14 DIAGNOSIS — S32502D Unspecified fracture of left pubis, subsequent encounter for fracture with routine healing: Secondary | ICD-10-CM | POA: Diagnosis not present

## 2020-11-14 DIAGNOSIS — M545 Low back pain, unspecified: Secondary | ICD-10-CM | POA: Diagnosis not present

## 2020-11-17 DIAGNOSIS — S32415D Nondisplaced fracture of anterior wall of left acetabulum, subsequent encounter for fracture with routine healing: Secondary | ICD-10-CM | POA: Diagnosis not present

## 2020-11-17 DIAGNOSIS — G8929 Other chronic pain: Secondary | ICD-10-CM | POA: Diagnosis not present

## 2020-11-17 DIAGNOSIS — G43909 Migraine, unspecified, not intractable, without status migrainosus: Secondary | ICD-10-CM | POA: Diagnosis not present

## 2020-11-17 DIAGNOSIS — S32502D Unspecified fracture of left pubis, subsequent encounter for fracture with routine healing: Secondary | ICD-10-CM | POA: Diagnosis not present

## 2020-11-17 DIAGNOSIS — M545 Low back pain, unspecified: Secondary | ICD-10-CM | POA: Diagnosis not present

## 2020-11-22 DIAGNOSIS — G43909 Migraine, unspecified, not intractable, without status migrainosus: Secondary | ICD-10-CM | POA: Diagnosis not present

## 2020-11-22 DIAGNOSIS — G8929 Other chronic pain: Secondary | ICD-10-CM | POA: Diagnosis not present

## 2020-11-22 DIAGNOSIS — M545 Low back pain, unspecified: Secondary | ICD-10-CM | POA: Diagnosis not present

## 2020-11-22 DIAGNOSIS — S32502D Unspecified fracture of left pubis, subsequent encounter for fracture with routine healing: Secondary | ICD-10-CM | POA: Diagnosis not present

## 2020-11-22 DIAGNOSIS — S32415D Nondisplaced fracture of anterior wall of left acetabulum, subsequent encounter for fracture with routine healing: Secondary | ICD-10-CM | POA: Diagnosis not present

## 2020-11-25 DIAGNOSIS — M545 Low back pain, unspecified: Secondary | ICD-10-CM | POA: Diagnosis not present

## 2020-11-25 DIAGNOSIS — S32415D Nondisplaced fracture of anterior wall of left acetabulum, subsequent encounter for fracture with routine healing: Secondary | ICD-10-CM | POA: Diagnosis not present

## 2020-11-25 DIAGNOSIS — S32502D Unspecified fracture of left pubis, subsequent encounter for fracture with routine healing: Secondary | ICD-10-CM | POA: Diagnosis not present

## 2020-11-25 DIAGNOSIS — G8929 Other chronic pain: Secondary | ICD-10-CM | POA: Diagnosis not present

## 2020-11-25 DIAGNOSIS — G43909 Migraine, unspecified, not intractable, without status migrainosus: Secondary | ICD-10-CM | POA: Diagnosis not present

## 2020-11-28 DIAGNOSIS — J449 Chronic obstructive pulmonary disease, unspecified: Secondary | ICD-10-CM | POA: Diagnosis not present

## 2020-11-28 DIAGNOSIS — R262 Difficulty in walking, not elsewhere classified: Secondary | ICD-10-CM | POA: Diagnosis not present

## 2020-11-28 DIAGNOSIS — M6281 Muscle weakness (generalized): Secondary | ICD-10-CM | POA: Diagnosis not present

## 2020-11-29 DIAGNOSIS — K219 Gastro-esophageal reflux disease without esophagitis: Secondary | ICD-10-CM | POA: Diagnosis not present

## 2020-11-29 DIAGNOSIS — R519 Headache, unspecified: Secondary | ICD-10-CM | POA: Diagnosis not present

## 2020-11-29 DIAGNOSIS — G43109 Migraine with aura, not intractable, without status migrainosus: Secondary | ICD-10-CM | POA: Diagnosis not present

## 2020-11-29 DIAGNOSIS — R197 Diarrhea, unspecified: Secondary | ICD-10-CM | POA: Diagnosis not present

## 2020-11-29 DIAGNOSIS — G479 Sleep disorder, unspecified: Secondary | ICD-10-CM | POA: Diagnosis not present

## 2020-12-05 DIAGNOSIS — H26493 Other secondary cataract, bilateral: Secondary | ICD-10-CM | POA: Diagnosis not present

## 2020-12-06 DIAGNOSIS — M533 Sacrococcygeal disorders, not elsewhere classified: Secondary | ICD-10-CM | POA: Diagnosis not present

## 2020-12-06 DIAGNOSIS — M47816 Spondylosis without myelopathy or radiculopathy, lumbar region: Secondary | ICD-10-CM | POA: Diagnosis not present

## 2020-12-06 DIAGNOSIS — M48061 Spinal stenosis, lumbar region without neurogenic claudication: Secondary | ICD-10-CM | POA: Diagnosis not present

## 2020-12-06 DIAGNOSIS — S83282A Other tear of lateral meniscus, current injury, left knee, initial encounter: Secondary | ICD-10-CM | POA: Diagnosis not present

## 2020-12-06 DIAGNOSIS — G894 Chronic pain syndrome: Secondary | ICD-10-CM | POA: Diagnosis not present

## 2020-12-06 DIAGNOSIS — Z8781 Personal history of (healed) traumatic fracture: Secondary | ICD-10-CM | POA: Diagnosis not present

## 2020-12-06 DIAGNOSIS — M5136 Other intervertebral disc degeneration, lumbar region: Secondary | ICD-10-CM | POA: Diagnosis not present

## 2020-12-06 DIAGNOSIS — Z79891 Long term (current) use of opiate analgesic: Secondary | ICD-10-CM | POA: Diagnosis not present

## 2020-12-06 DIAGNOSIS — M5416 Radiculopathy, lumbar region: Secondary | ICD-10-CM | POA: Diagnosis not present

## 2020-12-06 DIAGNOSIS — K5903 Drug induced constipation: Secondary | ICD-10-CM | POA: Diagnosis not present

## 2020-12-07 DIAGNOSIS — M5136 Other intervertebral disc degeneration, lumbar region: Secondary | ICD-10-CM | POA: Diagnosis not present

## 2020-12-07 DIAGNOSIS — M533 Sacrococcygeal disorders, not elsewhere classified: Secondary | ICD-10-CM | POA: Diagnosis not present

## 2020-12-07 DIAGNOSIS — Z8781 Personal history of (healed) traumatic fracture: Secondary | ICD-10-CM | POA: Diagnosis not present

## 2020-12-07 DIAGNOSIS — S83282A Other tear of lateral meniscus, current injury, left knee, initial encounter: Secondary | ICD-10-CM | POA: Diagnosis not present

## 2020-12-07 DIAGNOSIS — M47816 Spondylosis without myelopathy or radiculopathy, lumbar region: Secondary | ICD-10-CM | POA: Diagnosis not present

## 2020-12-07 DIAGNOSIS — M48061 Spinal stenosis, lumbar region without neurogenic claudication: Secondary | ICD-10-CM | POA: Diagnosis not present

## 2020-12-07 DIAGNOSIS — M5416 Radiculopathy, lumbar region: Secondary | ICD-10-CM | POA: Diagnosis not present

## 2020-12-07 DIAGNOSIS — K5903 Drug induced constipation: Secondary | ICD-10-CM | POA: Diagnosis not present

## 2020-12-13 DIAGNOSIS — M1712 Unilateral primary osteoarthritis, left knee: Secondary | ICD-10-CM | POA: Diagnosis not present

## 2020-12-21 DIAGNOSIS — M25531 Pain in right wrist: Secondary | ICD-10-CM | POA: Diagnosis not present

## 2020-12-21 DIAGNOSIS — G8929 Other chronic pain: Secondary | ICD-10-CM | POA: Diagnosis not present

## 2020-12-29 DIAGNOSIS — M48061 Spinal stenosis, lumbar region without neurogenic claudication: Secondary | ICD-10-CM | POA: Diagnosis not present

## 2020-12-29 DIAGNOSIS — M5416 Radiculopathy, lumbar region: Secondary | ICD-10-CM | POA: Diagnosis not present

## 2020-12-29 DIAGNOSIS — M47816 Spondylosis without myelopathy or radiculopathy, lumbar region: Secondary | ICD-10-CM | POA: Diagnosis not present

## 2020-12-29 DIAGNOSIS — M5136 Other intervertebral disc degeneration, lumbar region: Secondary | ICD-10-CM | POA: Diagnosis not present

## 2020-12-29 DIAGNOSIS — M461 Sacroiliitis, not elsewhere classified: Secondary | ICD-10-CM | POA: Diagnosis not present

## 2020-12-29 DIAGNOSIS — Z8781 Personal history of (healed) traumatic fracture: Secondary | ICD-10-CM | POA: Diagnosis not present

## 2020-12-29 DIAGNOSIS — G894 Chronic pain syndrome: Secondary | ICD-10-CM | POA: Diagnosis not present

## 2021-01-10 DIAGNOSIS — M8448XA Pathological fracture, other site, initial encounter for fracture: Secondary | ICD-10-CM | POA: Diagnosis not present

## 2021-01-10 DIAGNOSIS — M1712 Unilateral primary osteoarthritis, left knee: Secondary | ICD-10-CM | POA: Diagnosis not present

## 2021-01-26 DIAGNOSIS — M5136 Other intervertebral disc degeneration, lumbar region: Secondary | ICD-10-CM | POA: Diagnosis not present

## 2021-01-26 DIAGNOSIS — R262 Difficulty in walking, not elsewhere classified: Secondary | ICD-10-CM | POA: Diagnosis not present

## 2021-01-26 DIAGNOSIS — J449 Chronic obstructive pulmonary disease, unspecified: Secondary | ICD-10-CM | POA: Diagnosis not present

## 2021-01-26 DIAGNOSIS — M48061 Spinal stenosis, lumbar region without neurogenic claudication: Secondary | ICD-10-CM | POA: Diagnosis not present

## 2021-01-26 DIAGNOSIS — Z8781 Personal history of (healed) traumatic fracture: Secondary | ICD-10-CM | POA: Diagnosis not present

## 2021-01-26 DIAGNOSIS — M47816 Spondylosis without myelopathy or radiculopathy, lumbar region: Secondary | ICD-10-CM | POA: Diagnosis not present

## 2021-01-26 DIAGNOSIS — M461 Sacroiliitis, not elsewhere classified: Secondary | ICD-10-CM | POA: Diagnosis not present

## 2021-01-26 DIAGNOSIS — M6281 Muscle weakness (generalized): Secondary | ICD-10-CM | POA: Diagnosis not present

## 2021-01-26 DIAGNOSIS — M5416 Radiculopathy, lumbar region: Secondary | ICD-10-CM | POA: Diagnosis not present

## 2021-02-16 DIAGNOSIS — J0100 Acute maxillary sinusitis, unspecified: Secondary | ICD-10-CM | POA: Diagnosis not present

## 2021-02-23 DIAGNOSIS — Z79891 Long term (current) use of opiate analgesic: Secondary | ICD-10-CM | POA: Diagnosis not present

## 2021-02-23 DIAGNOSIS — M5136 Other intervertebral disc degeneration, lumbar region: Secondary | ICD-10-CM | POA: Diagnosis not present

## 2021-02-23 DIAGNOSIS — M461 Sacroiliitis, not elsewhere classified: Secondary | ICD-10-CM | POA: Diagnosis not present

## 2021-02-23 DIAGNOSIS — M47816 Spondylosis without myelopathy or radiculopathy, lumbar region: Secondary | ICD-10-CM | POA: Diagnosis not present

## 2021-02-23 DIAGNOSIS — M48061 Spinal stenosis, lumbar region without neurogenic claudication: Secondary | ICD-10-CM | POA: Diagnosis not present

## 2021-02-23 DIAGNOSIS — M5416 Radiculopathy, lumbar region: Secondary | ICD-10-CM | POA: Diagnosis not present

## 2021-02-23 DIAGNOSIS — Z8781 Personal history of (healed) traumatic fracture: Secondary | ICD-10-CM | POA: Diagnosis not present

## 2021-02-25 DIAGNOSIS — M6281 Muscle weakness (generalized): Secondary | ICD-10-CM | POA: Diagnosis not present

## 2021-02-25 DIAGNOSIS — R262 Difficulty in walking, not elsewhere classified: Secondary | ICD-10-CM | POA: Diagnosis not present

## 2021-02-25 DIAGNOSIS — J449 Chronic obstructive pulmonary disease, unspecified: Secondary | ICD-10-CM | POA: Diagnosis not present

## 2021-02-28 DIAGNOSIS — G43909 Migraine, unspecified, not intractable, without status migrainosus: Secondary | ICD-10-CM | POA: Diagnosis not present

## 2021-02-28 DIAGNOSIS — J329 Chronic sinusitis, unspecified: Secondary | ICD-10-CM | POA: Diagnosis not present

## 2021-02-28 DIAGNOSIS — L659 Nonscarring hair loss, unspecified: Secondary | ICD-10-CM | POA: Diagnosis not present

## 2021-03-16 DIAGNOSIS — M461 Sacroiliitis, not elsewhere classified: Secondary | ICD-10-CM | POA: Diagnosis not present

## 2021-03-16 DIAGNOSIS — Z8781 Personal history of (healed) traumatic fracture: Secondary | ICD-10-CM | POA: Diagnosis not present

## 2021-03-16 DIAGNOSIS — M48061 Spinal stenosis, lumbar region without neurogenic claudication: Secondary | ICD-10-CM | POA: Diagnosis not present

## 2021-03-16 DIAGNOSIS — M5416 Radiculopathy, lumbar region: Secondary | ICD-10-CM | POA: Diagnosis not present

## 2021-03-16 DIAGNOSIS — M5136 Other intervertebral disc degeneration, lumbar region: Secondary | ICD-10-CM | POA: Diagnosis not present

## 2021-03-16 DIAGNOSIS — M47816 Spondylosis without myelopathy or radiculopathy, lumbar region: Secondary | ICD-10-CM | POA: Diagnosis not present

## 2021-03-16 DIAGNOSIS — G894 Chronic pain syndrome: Secondary | ICD-10-CM | POA: Diagnosis not present

## 2021-03-28 DIAGNOSIS — M6281 Muscle weakness (generalized): Secondary | ICD-10-CM | POA: Diagnosis not present

## 2021-03-28 DIAGNOSIS — M461 Sacroiliitis, not elsewhere classified: Secondary | ICD-10-CM | POA: Diagnosis not present

## 2021-03-28 DIAGNOSIS — R262 Difficulty in walking, not elsewhere classified: Secondary | ICD-10-CM | POA: Diagnosis not present

## 2021-03-28 DIAGNOSIS — J449 Chronic obstructive pulmonary disease, unspecified: Secondary | ICD-10-CM | POA: Diagnosis not present

## 2021-04-04 DIAGNOSIS — G8929 Other chronic pain: Secondary | ICD-10-CM | POA: Diagnosis not present

## 2021-04-04 DIAGNOSIS — M25531 Pain in right wrist: Secondary | ICD-10-CM | POA: Diagnosis not present

## 2021-04-04 DIAGNOSIS — M1712 Unilateral primary osteoarthritis, left knee: Secondary | ICD-10-CM | POA: Diagnosis not present

## 2021-04-13 DIAGNOSIS — G479 Sleep disorder, unspecified: Secondary | ICD-10-CM | POA: Diagnosis not present

## 2021-04-13 DIAGNOSIS — M461 Sacroiliitis, not elsewhere classified: Secondary | ICD-10-CM | POA: Diagnosis not present

## 2021-04-13 DIAGNOSIS — M5416 Radiculopathy, lumbar region: Secondary | ICD-10-CM | POA: Diagnosis not present

## 2021-04-13 DIAGNOSIS — G894 Chronic pain syndrome: Secondary | ICD-10-CM | POA: Diagnosis not present

## 2021-04-13 DIAGNOSIS — M5136 Other intervertebral disc degeneration, lumbar region: Secondary | ICD-10-CM | POA: Diagnosis not present

## 2021-04-13 DIAGNOSIS — G43909 Migraine, unspecified, not intractable, without status migrainosus: Secondary | ICD-10-CM | POA: Diagnosis not present

## 2021-04-13 DIAGNOSIS — Z8781 Personal history of (healed) traumatic fracture: Secondary | ICD-10-CM | POA: Diagnosis not present

## 2021-04-13 DIAGNOSIS — M48061 Spinal stenosis, lumbar region without neurogenic claudication: Secondary | ICD-10-CM | POA: Diagnosis not present

## 2021-04-13 DIAGNOSIS — Z23 Encounter for immunization: Secondary | ICD-10-CM | POA: Diagnosis not present

## 2021-04-13 DIAGNOSIS — M47816 Spondylosis without myelopathy or radiculopathy, lumbar region: Secondary | ICD-10-CM | POA: Diagnosis not present

## 2021-04-13 DIAGNOSIS — J309 Allergic rhinitis, unspecified: Secondary | ICD-10-CM | POA: Diagnosis not present

## 2021-04-26 DIAGNOSIS — R0982 Postnasal drip: Secondary | ICD-10-CM | POA: Diagnosis not present

## 2021-04-26 DIAGNOSIS — J0110 Acute frontal sinusitis, unspecified: Secondary | ICD-10-CM | POA: Diagnosis not present

## 2021-04-26 DIAGNOSIS — R5383 Other fatigue: Secondary | ICD-10-CM | POA: Diagnosis not present

## 2021-04-26 DIAGNOSIS — R059 Cough, unspecified: Secondary | ICD-10-CM | POA: Diagnosis not present

## 2021-04-26 DIAGNOSIS — R0981 Nasal congestion: Secondary | ICD-10-CM | POA: Diagnosis not present

## 2021-04-26 DIAGNOSIS — R519 Headache, unspecified: Secondary | ICD-10-CM | POA: Diagnosis not present

## 2021-04-27 DIAGNOSIS — M6281 Muscle weakness (generalized): Secondary | ICD-10-CM | POA: Diagnosis not present

## 2021-04-27 DIAGNOSIS — J449 Chronic obstructive pulmonary disease, unspecified: Secondary | ICD-10-CM | POA: Diagnosis not present

## 2021-04-27 DIAGNOSIS — R262 Difficulty in walking, not elsewhere classified: Secondary | ICD-10-CM | POA: Diagnosis not present

## 2021-05-18 DIAGNOSIS — Z8781 Personal history of (healed) traumatic fracture: Secondary | ICD-10-CM | POA: Diagnosis not present

## 2021-05-18 DIAGNOSIS — M461 Sacroiliitis, not elsewhere classified: Secondary | ICD-10-CM | POA: Diagnosis not present

## 2021-05-18 DIAGNOSIS — M5416 Radiculopathy, lumbar region: Secondary | ICD-10-CM | POA: Diagnosis not present

## 2021-05-18 DIAGNOSIS — M5136 Other intervertebral disc degeneration, lumbar region: Secondary | ICD-10-CM | POA: Diagnosis not present

## 2021-05-18 DIAGNOSIS — M47816 Spondylosis without myelopathy or radiculopathy, lumbar region: Secondary | ICD-10-CM | POA: Diagnosis not present

## 2021-05-18 DIAGNOSIS — M48061 Spinal stenosis, lumbar region without neurogenic claudication: Secondary | ICD-10-CM | POA: Diagnosis not present

## 2021-05-18 DIAGNOSIS — G894 Chronic pain syndrome: Secondary | ICD-10-CM | POA: Diagnosis not present

## 2021-05-28 DIAGNOSIS — M6281 Muscle weakness (generalized): Secondary | ICD-10-CM | POA: Diagnosis not present

## 2021-05-28 DIAGNOSIS — J449 Chronic obstructive pulmonary disease, unspecified: Secondary | ICD-10-CM | POA: Diagnosis not present

## 2021-05-28 DIAGNOSIS — R262 Difficulty in walking, not elsewhere classified: Secondary | ICD-10-CM | POA: Diagnosis not present

## 2021-05-30 DIAGNOSIS — M47816 Spondylosis without myelopathy or radiculopathy, lumbar region: Secondary | ICD-10-CM | POA: Diagnosis not present

## 2021-06-21 DIAGNOSIS — M1711 Unilateral primary osteoarthritis, right knee: Secondary | ICD-10-CM | POA: Diagnosis not present

## 2021-06-22 DIAGNOSIS — M545 Low back pain, unspecified: Secondary | ICD-10-CM | POA: Diagnosis not present

## 2021-06-22 DIAGNOSIS — M461 Sacroiliitis, not elsewhere classified: Secondary | ICD-10-CM | POA: Diagnosis not present

## 2021-06-22 DIAGNOSIS — Z8781 Personal history of (healed) traumatic fracture: Secondary | ICD-10-CM | POA: Diagnosis not present

## 2021-06-22 DIAGNOSIS — R208 Other disturbances of skin sensation: Secondary | ICD-10-CM | POA: Diagnosis not present

## 2021-06-22 DIAGNOSIS — Z79891 Long term (current) use of opiate analgesic: Secondary | ICD-10-CM | POA: Diagnosis not present

## 2021-06-22 DIAGNOSIS — G894 Chronic pain syndrome: Secondary | ICD-10-CM | POA: Diagnosis not present

## 2021-06-22 DIAGNOSIS — M47816 Spondylosis without myelopathy or radiculopathy, lumbar region: Secondary | ICD-10-CM | POA: Diagnosis not present

## 2021-06-22 DIAGNOSIS — M5136 Other intervertebral disc degeneration, lumbar region: Secondary | ICD-10-CM | POA: Diagnosis not present

## 2021-06-22 DIAGNOSIS — M5416 Radiculopathy, lumbar region: Secondary | ICD-10-CM | POA: Diagnosis not present

## 2021-06-22 DIAGNOSIS — M48061 Spinal stenosis, lumbar region without neurogenic claudication: Secondary | ICD-10-CM | POA: Diagnosis not present

## 2021-06-27 DIAGNOSIS — J449 Chronic obstructive pulmonary disease, unspecified: Secondary | ICD-10-CM | POA: Diagnosis not present

## 2021-06-27 DIAGNOSIS — M6281 Muscle weakness (generalized): Secondary | ICD-10-CM | POA: Diagnosis not present

## 2021-06-27 DIAGNOSIS — R262 Difficulty in walking, not elsewhere classified: Secondary | ICD-10-CM | POA: Diagnosis not present

## 2021-07-05 DIAGNOSIS — M1712 Unilateral primary osteoarthritis, left knee: Secondary | ICD-10-CM | POA: Diagnosis not present

## 2021-07-05 DIAGNOSIS — G8929 Other chronic pain: Secondary | ICD-10-CM | POA: Diagnosis not present

## 2021-07-05 DIAGNOSIS — M25531 Pain in right wrist: Secondary | ICD-10-CM | POA: Diagnosis not present

## 2021-07-18 DIAGNOSIS — M419 Scoliosis, unspecified: Secondary | ICD-10-CM | POA: Diagnosis not present

## 2021-07-18 DIAGNOSIS — R109 Unspecified abdominal pain: Secondary | ICD-10-CM | POA: Diagnosis not present

## 2021-07-18 DIAGNOSIS — R10814 Left lower quadrant abdominal tenderness: Secondary | ICD-10-CM | POA: Diagnosis not present

## 2021-07-26 DIAGNOSIS — J449 Chronic obstructive pulmonary disease, unspecified: Secondary | ICD-10-CM | POA: Diagnosis not present

## 2021-07-26 DIAGNOSIS — R262 Difficulty in walking, not elsewhere classified: Secondary | ICD-10-CM | POA: Diagnosis not present

## 2021-07-26 DIAGNOSIS — M6281 Muscle weakness (generalized): Secondary | ICD-10-CM | POA: Diagnosis not present

## 2021-07-27 DIAGNOSIS — G894 Chronic pain syndrome: Secondary | ICD-10-CM | POA: Diagnosis not present

## 2021-07-27 DIAGNOSIS — K5903 Drug induced constipation: Secondary | ICD-10-CM | POA: Diagnosis not present

## 2021-07-27 DIAGNOSIS — M47816 Spondylosis without myelopathy or radiculopathy, lumbar region: Secondary | ICD-10-CM | POA: Diagnosis not present

## 2021-07-27 DIAGNOSIS — Z6821 Body mass index (BMI) 21.0-21.9, adult: Secondary | ICD-10-CM | POA: Diagnosis not present

## 2021-07-27 DIAGNOSIS — R7303 Prediabetes: Secondary | ICD-10-CM | POA: Diagnosis not present

## 2021-07-27 DIAGNOSIS — M5416 Radiculopathy, lumbar region: Secondary | ICD-10-CM | POA: Diagnosis not present

## 2021-07-27 DIAGNOSIS — M810 Age-related osteoporosis without current pathological fracture: Secondary | ICD-10-CM | POA: Diagnosis not present

## 2021-07-27 DIAGNOSIS — G43109 Migraine with aura, not intractable, without status migrainosus: Secondary | ICD-10-CM | POA: Diagnosis not present

## 2021-07-27 DIAGNOSIS — M461 Sacroiliitis, not elsewhere classified: Secondary | ICD-10-CM | POA: Diagnosis not present

## 2021-07-27 DIAGNOSIS — M545 Low back pain, unspecified: Secondary | ICD-10-CM | POA: Diagnosis not present

## 2021-07-27 DIAGNOSIS — E785 Hyperlipidemia, unspecified: Secondary | ICD-10-CM | POA: Diagnosis not present

## 2021-07-27 DIAGNOSIS — M5136 Other intervertebral disc degeneration, lumbar region: Secondary | ICD-10-CM | POA: Diagnosis not present

## 2021-07-27 DIAGNOSIS — Z0000 Encounter for general adult medical examination without abnormal findings: Secondary | ICD-10-CM | POA: Diagnosis not present

## 2021-07-27 DIAGNOSIS — R208 Other disturbances of skin sensation: Secondary | ICD-10-CM | POA: Diagnosis not present

## 2021-07-27 DIAGNOSIS — I10 Essential (primary) hypertension: Secondary | ICD-10-CM | POA: Diagnosis not present

## 2021-07-27 DIAGNOSIS — D649 Anemia, unspecified: Secondary | ICD-10-CM | POA: Diagnosis not present

## 2021-07-27 DIAGNOSIS — M48061 Spinal stenosis, lumbar region without neurogenic claudication: Secondary | ICD-10-CM | POA: Diagnosis not present

## 2021-07-27 DIAGNOSIS — T402X5A Adverse effect of other opioids, initial encounter: Secondary | ICD-10-CM | POA: Diagnosis not present

## 2021-07-27 DIAGNOSIS — Z8781 Personal history of (healed) traumatic fracture: Secondary | ICD-10-CM | POA: Diagnosis not present

## 2021-07-27 DIAGNOSIS — J309 Allergic rhinitis, unspecified: Secondary | ICD-10-CM | POA: Diagnosis not present

## 2021-08-24 DIAGNOSIS — M5416 Radiculopathy, lumbar region: Secondary | ICD-10-CM | POA: Diagnosis not present

## 2021-08-24 DIAGNOSIS — M47816 Spondylosis without myelopathy or radiculopathy, lumbar region: Secondary | ICD-10-CM | POA: Diagnosis not present

## 2021-08-24 DIAGNOSIS — M48061 Spinal stenosis, lumbar region without neurogenic claudication: Secondary | ICD-10-CM | POA: Diagnosis not present

## 2021-08-24 DIAGNOSIS — Z8781 Personal history of (healed) traumatic fracture: Secondary | ICD-10-CM | POA: Diagnosis not present

## 2021-08-24 DIAGNOSIS — M545 Low back pain, unspecified: Secondary | ICD-10-CM | POA: Diagnosis not present

## 2021-08-24 DIAGNOSIS — M461 Sacroiliitis, not elsewhere classified: Secondary | ICD-10-CM | POA: Diagnosis not present

## 2021-08-24 DIAGNOSIS — M5136 Other intervertebral disc degeneration, lumbar region: Secondary | ICD-10-CM | POA: Diagnosis not present

## 2021-08-24 DIAGNOSIS — R208 Other disturbances of skin sensation: Secondary | ICD-10-CM | POA: Diagnosis not present

## 2021-08-31 DIAGNOSIS — M48061 Spinal stenosis, lumbar region without neurogenic claudication: Secondary | ICD-10-CM | POA: Diagnosis not present

## 2021-08-31 DIAGNOSIS — Z8781 Personal history of (healed) traumatic fracture: Secondary | ICD-10-CM | POA: Diagnosis not present

## 2021-08-31 DIAGNOSIS — M4316 Spondylolisthesis, lumbar region: Secondary | ICD-10-CM | POA: Diagnosis not present

## 2021-08-31 DIAGNOSIS — M4856XA Collapsed vertebra, not elsewhere classified, lumbar region, initial encounter for fracture: Secondary | ICD-10-CM | POA: Diagnosis not present

## 2021-08-31 DIAGNOSIS — M5136 Other intervertebral disc degeneration, lumbar region: Secondary | ICD-10-CM | POA: Diagnosis not present

## 2021-08-31 DIAGNOSIS — M47816 Spondylosis without myelopathy or radiculopathy, lumbar region: Secondary | ICD-10-CM | POA: Diagnosis not present

## 2021-08-31 DIAGNOSIS — M8589 Other specified disorders of bone density and structure, multiple sites: Secondary | ICD-10-CM | POA: Diagnosis not present

## 2021-08-31 DIAGNOSIS — M8588 Other specified disorders of bone density and structure, other site: Secondary | ICD-10-CM | POA: Diagnosis not present

## 2021-09-14 DIAGNOSIS — M545 Low back pain, unspecified: Secondary | ICD-10-CM | POA: Diagnosis not present

## 2021-09-14 DIAGNOSIS — M47816 Spondylosis without myelopathy or radiculopathy, lumbar region: Secondary | ICD-10-CM | POA: Diagnosis not present

## 2021-09-14 DIAGNOSIS — G894 Chronic pain syndrome: Secondary | ICD-10-CM | POA: Diagnosis not present

## 2021-09-14 DIAGNOSIS — Z8781 Personal history of (healed) traumatic fracture: Secondary | ICD-10-CM | POA: Diagnosis not present

## 2021-09-14 DIAGNOSIS — M5416 Radiculopathy, lumbar region: Secondary | ICD-10-CM | POA: Diagnosis not present

## 2021-09-14 DIAGNOSIS — M5136 Other intervertebral disc degeneration, lumbar region: Secondary | ICD-10-CM | POA: Diagnosis not present

## 2021-09-14 DIAGNOSIS — K5903 Drug induced constipation: Secondary | ICD-10-CM | POA: Diagnosis not present

## 2021-09-14 DIAGNOSIS — M48061 Spinal stenosis, lumbar region without neurogenic claudication: Secondary | ICD-10-CM | POA: Diagnosis not present

## 2021-09-14 DIAGNOSIS — Z79891 Long term (current) use of opiate analgesic: Secondary | ICD-10-CM | POA: Diagnosis not present

## 2021-09-14 DIAGNOSIS — M461 Sacroiliitis, not elsewhere classified: Secondary | ICD-10-CM | POA: Diagnosis not present

## 2021-09-14 DIAGNOSIS — R208 Other disturbances of skin sensation: Secondary | ICD-10-CM | POA: Diagnosis not present

## 2021-10-11 DIAGNOSIS — M1712 Unilateral primary osteoarthritis, left knee: Secondary | ICD-10-CM | POA: Diagnosis not present

## 2021-10-19 DIAGNOSIS — R208 Other disturbances of skin sensation: Secondary | ICD-10-CM | POA: Diagnosis not present

## 2021-10-19 DIAGNOSIS — M47816 Spondylosis without myelopathy or radiculopathy, lumbar region: Secondary | ICD-10-CM | POA: Diagnosis not present

## 2021-10-19 DIAGNOSIS — K5903 Drug induced constipation: Secondary | ICD-10-CM | POA: Diagnosis not present

## 2021-10-19 DIAGNOSIS — Z8781 Personal history of (healed) traumatic fracture: Secondary | ICD-10-CM | POA: Diagnosis not present

## 2021-10-19 DIAGNOSIS — M545 Low back pain, unspecified: Secondary | ICD-10-CM | POA: Diagnosis not present

## 2021-10-19 DIAGNOSIS — M48061 Spinal stenosis, lumbar region without neurogenic claudication: Secondary | ICD-10-CM | POA: Diagnosis not present

## 2021-10-19 DIAGNOSIS — M5416 Radiculopathy, lumbar region: Secondary | ICD-10-CM | POA: Diagnosis not present

## 2021-10-19 DIAGNOSIS — M461 Sacroiliitis, not elsewhere classified: Secondary | ICD-10-CM | POA: Diagnosis not present

## 2021-10-19 DIAGNOSIS — M5136 Other intervertebral disc degeneration, lumbar region: Secondary | ICD-10-CM | POA: Diagnosis not present

## 2021-10-27 DIAGNOSIS — M549 Dorsalgia, unspecified: Secondary | ICD-10-CM | POA: Diagnosis not present

## 2021-10-27 DIAGNOSIS — G4700 Insomnia, unspecified: Secondary | ICD-10-CM | POA: Diagnosis not present

## 2021-10-27 DIAGNOSIS — G43909 Migraine, unspecified, not intractable, without status migrainosus: Secondary | ICD-10-CM | POA: Diagnosis not present

## 2021-10-27 DIAGNOSIS — G8929 Other chronic pain: Secondary | ICD-10-CM | POA: Diagnosis not present

## 2021-10-27 DIAGNOSIS — R140 Abdominal distension (gaseous): Secondary | ICD-10-CM | POA: Diagnosis not present

## 2021-11-03 DIAGNOSIS — R140 Abdominal distension (gaseous): Secondary | ICD-10-CM | POA: Diagnosis not present

## 2021-11-03 DIAGNOSIS — R918 Other nonspecific abnormal finding of lung field: Secondary | ICD-10-CM | POA: Diagnosis not present

## 2021-11-09 DIAGNOSIS — K5903 Drug induced constipation: Secondary | ICD-10-CM | POA: Diagnosis not present

## 2021-11-09 DIAGNOSIS — Z8781 Personal history of (healed) traumatic fracture: Secondary | ICD-10-CM | POA: Diagnosis not present

## 2021-11-09 DIAGNOSIS — Z79891 Long term (current) use of opiate analgesic: Secondary | ICD-10-CM | POA: Diagnosis not present

## 2021-11-09 DIAGNOSIS — Z1389 Encounter for screening for other disorder: Secondary | ICD-10-CM | POA: Diagnosis not present

## 2021-11-09 DIAGNOSIS — M47816 Spondylosis without myelopathy or radiculopathy, lumbar region: Secondary | ICD-10-CM | POA: Diagnosis not present

## 2021-11-09 DIAGNOSIS — R208 Other disturbances of skin sensation: Secondary | ICD-10-CM | POA: Diagnosis not present

## 2021-11-09 DIAGNOSIS — M5136 Other intervertebral disc degeneration, lumbar region: Secondary | ICD-10-CM | POA: Diagnosis not present

## 2021-11-09 DIAGNOSIS — M545 Low back pain, unspecified: Secondary | ICD-10-CM | POA: Diagnosis not present

## 2021-11-09 DIAGNOSIS — M5416 Radiculopathy, lumbar region: Secondary | ICD-10-CM | POA: Diagnosis not present

## 2021-11-09 DIAGNOSIS — M48061 Spinal stenosis, lumbar region without neurogenic claudication: Secondary | ICD-10-CM | POA: Diagnosis not present

## 2021-11-21 DIAGNOSIS — M8008XA Age-related osteoporosis with current pathological fracture, vertebra(e), initial encounter for fracture: Secondary | ICD-10-CM | POA: Diagnosis not present

## 2021-11-21 DIAGNOSIS — M549 Dorsalgia, unspecified: Secondary | ICD-10-CM | POA: Diagnosis not present

## 2021-11-23 DIAGNOSIS — M1712 Unilateral primary osteoarthritis, left knee: Secondary | ICD-10-CM | POA: Diagnosis not present

## 2021-11-24 DIAGNOSIS — M4316 Spondylolisthesis, lumbar region: Secondary | ICD-10-CM | POA: Diagnosis not present

## 2021-11-24 DIAGNOSIS — M4126 Other idiopathic scoliosis, lumbar region: Secondary | ICD-10-CM | POA: Diagnosis not present

## 2021-11-24 DIAGNOSIS — M545 Low back pain, unspecified: Secondary | ICD-10-CM | POA: Diagnosis not present

## 2021-11-28 DIAGNOSIS — G8929 Other chronic pain: Secondary | ICD-10-CM | POA: Diagnosis not present

## 2021-11-28 DIAGNOSIS — M25531 Pain in right wrist: Secondary | ICD-10-CM | POA: Diagnosis not present

## 2021-11-30 DIAGNOSIS — M1712 Unilateral primary osteoarthritis, left knee: Secondary | ICD-10-CM | POA: Diagnosis not present

## 2021-12-05 DIAGNOSIS — M47816 Spondylosis without myelopathy or radiculopathy, lumbar region: Secondary | ICD-10-CM | POA: Diagnosis not present

## 2021-12-05 DIAGNOSIS — M8008XA Age-related osteoporosis with current pathological fracture, vertebra(e), initial encounter for fracture: Secondary | ICD-10-CM | POA: Diagnosis not present

## 2021-12-05 DIAGNOSIS — M8008XD Age-related osteoporosis with current pathological fracture, vertebra(e), subsequent encounter for fracture with routine healing: Secondary | ICD-10-CM | POA: Diagnosis not present

## 2021-12-06 DIAGNOSIS — M1712 Unilateral primary osteoarthritis, left knee: Secondary | ICD-10-CM | POA: Diagnosis not present

## 2021-12-14 DIAGNOSIS — G894 Chronic pain syndrome: Secondary | ICD-10-CM | POA: Diagnosis not present

## 2021-12-14 DIAGNOSIS — M545 Low back pain, unspecified: Secondary | ICD-10-CM | POA: Diagnosis not present

## 2021-12-14 DIAGNOSIS — M47816 Spondylosis without myelopathy or radiculopathy, lumbar region: Secondary | ICD-10-CM | POA: Diagnosis not present

## 2021-12-14 DIAGNOSIS — R208 Other disturbances of skin sensation: Secondary | ICD-10-CM | POA: Diagnosis not present

## 2021-12-14 DIAGNOSIS — M5136 Other intervertebral disc degeneration, lumbar region: Secondary | ICD-10-CM | POA: Diagnosis not present

## 2021-12-14 DIAGNOSIS — K5903 Drug induced constipation: Secondary | ICD-10-CM | POA: Diagnosis not present

## 2021-12-14 DIAGNOSIS — Z79891 Long term (current) use of opiate analgesic: Secondary | ICD-10-CM | POA: Diagnosis not present

## 2021-12-14 DIAGNOSIS — M48061 Spinal stenosis, lumbar region without neurogenic claudication: Secondary | ICD-10-CM | POA: Diagnosis not present

## 2021-12-14 DIAGNOSIS — M5416 Radiculopathy, lumbar region: Secondary | ICD-10-CM | POA: Diagnosis not present

## 2021-12-14 DIAGNOSIS — Z8781 Personal history of (healed) traumatic fracture: Secondary | ICD-10-CM | POA: Diagnosis not present

## 2021-12-27 DIAGNOSIS — Z1231 Encounter for screening mammogram for malignant neoplasm of breast: Secondary | ICD-10-CM | POA: Diagnosis not present

## 2021-12-27 DIAGNOSIS — M810 Age-related osteoporosis without current pathological fracture: Secondary | ICD-10-CM | POA: Diagnosis not present

## 2022-01-08 DIAGNOSIS — Z7189 Other specified counseling: Secondary | ICD-10-CM | POA: Diagnosis not present

## 2022-01-08 DIAGNOSIS — Z7983 Long term (current) use of bisphosphonates: Secondary | ICD-10-CM | POA: Diagnosis not present

## 2022-01-08 DIAGNOSIS — Z5181 Encounter for therapeutic drug level monitoring: Secondary | ICD-10-CM | POA: Diagnosis not present

## 2022-01-08 DIAGNOSIS — Z8781 Personal history of (healed) traumatic fracture: Secondary | ICD-10-CM | POA: Diagnosis not present

## 2022-01-08 DIAGNOSIS — Z87310 Personal history of (healed) osteoporosis fracture: Secondary | ICD-10-CM | POA: Diagnosis not present

## 2022-01-08 DIAGNOSIS — M818 Other osteoporosis without current pathological fracture: Secondary | ICD-10-CM | POA: Diagnosis not present

## 2022-01-30 DIAGNOSIS — G43109 Migraine with aura, not intractable, without status migrainosus: Secondary | ICD-10-CM | POA: Diagnosis not present

## 2022-01-30 DIAGNOSIS — G894 Chronic pain syndrome: Secondary | ICD-10-CM | POA: Diagnosis not present

## 2022-01-30 DIAGNOSIS — Z23 Encounter for immunization: Secondary | ICD-10-CM | POA: Diagnosis not present

## 2022-01-30 DIAGNOSIS — M810 Age-related osteoporosis without current pathological fracture: Secondary | ICD-10-CM | POA: Diagnosis not present

## 2022-02-15 DIAGNOSIS — M47816 Spondylosis without myelopathy or radiculopathy, lumbar region: Secondary | ICD-10-CM | POA: Diagnosis not present

## 2022-02-15 DIAGNOSIS — M48061 Spinal stenosis, lumbar region without neurogenic claudication: Secondary | ICD-10-CM | POA: Diagnosis not present

## 2022-02-15 DIAGNOSIS — M545 Low back pain, unspecified: Secondary | ICD-10-CM | POA: Diagnosis not present

## 2022-02-15 DIAGNOSIS — Z8781 Personal history of (healed) traumatic fracture: Secondary | ICD-10-CM | POA: Diagnosis not present

## 2022-02-15 DIAGNOSIS — M5136 Other intervertebral disc degeneration, lumbar region: Secondary | ICD-10-CM | POA: Diagnosis not present

## 2022-02-15 DIAGNOSIS — R208 Other disturbances of skin sensation: Secondary | ICD-10-CM | POA: Diagnosis not present

## 2022-02-15 DIAGNOSIS — M5416 Radiculopathy, lumbar region: Secondary | ICD-10-CM | POA: Diagnosis not present

## 2022-02-15 DIAGNOSIS — K5903 Drug induced constipation: Secondary | ICD-10-CM | POA: Diagnosis not present

## 2022-02-23 ENCOUNTER — Ambulatory Visit: Payer: Medicare Other | Admitting: Gastroenterology

## 2022-02-23 ENCOUNTER — Encounter: Payer: Self-pay | Admitting: Gastroenterology

## 2022-02-23 VITALS — BP 138/62 | HR 85 | Ht 60.0 in | Wt 115.1 lb

## 2022-02-23 DIAGNOSIS — R140 Abdominal distension (gaseous): Secondary | ICD-10-CM | POA: Diagnosis not present

## 2022-02-23 DIAGNOSIS — K219 Gastro-esophageal reflux disease without esophagitis: Secondary | ICD-10-CM | POA: Diagnosis not present

## 2022-02-23 DIAGNOSIS — R109 Unspecified abdominal pain: Secondary | ICD-10-CM | POA: Diagnosis not present

## 2022-02-23 DIAGNOSIS — K5903 Drug induced constipation: Secondary | ICD-10-CM

## 2022-02-23 DIAGNOSIS — T402X5A Adverse effect of other opioids, initial encounter: Secondary | ICD-10-CM

## 2022-02-23 MED ORDER — NA SULFATE-K SULFATE-MG SULF 17.5-3.13-1.6 GM/177ML PO SOLN: 1.0000 | Freq: Once | ORAL | 0 refills | Status: AC

## 2022-02-23 NOTE — Progress Notes (Signed)
Chief Complaint: [name] problems  Referring Provider:  Townsend Roger, MD      ASSESSMENT AND PLAN;   #1. Abdo pain (L>R) with distention.   #2.  Opioid-induced constipation with prev IBS-C.  #3.  GERD  Plan: -CBC, CMP, CRP, TSH -CT AP with contrast -EGD/colon with 2 day prep -Continie omeprazole 40 BID for now. -Continue MiraLAX 17 g p.o. QD -Minimize pain meds -Increase water intake. -D/W pt and family.   I discussed EGD/Colonoscopy- the indications, risks, alternatives and potential complications including, but not limited to bleeding, infection, reaction to meds, damage to internal organs, cardiac and/or pulmonary problems, and perforation requiring surgery. The possibility that significant findings could be missed was explained. All ? were answered. Pt consents to proceed.   HPI:    [name] is a 56 y.o. female  With multiple medical problems including chronic back pain d/t DJD req narcotics, HTN, migraines, anxiety/depression, history of SDH.  With longstanding history of chronic constipation Worsened by narcotics. She has tried multiple medications in the past including Linzess, magnesium supplements which did help somewhat.  Most recently she has been taking MiraLAX with some relief.  C/O generalized abdominal distention especially after eating.  She also complains of lower abdominal bilateral pain which is more on the left side than on the right side.  This increases when she walks.  No relationship to eating or defecation.  No fever chills or night sweats.  She does have nausea but no vomiting.  Has longstanding history of gastroesophageal reflux and has been on omeprazole 40 mg p.o. twice daily over last several years.  She denies having any odynophagia or dysphagia.  No sodas, chocolates, chewing gums, artificial sweeteners and candy. No NSAIDs except Mobic  No jaundice dark urine or pale stools.  Never had colon.  He has been offered in the past  which she declined.  She underwent CT scan Abdo (no pelvis) without contrast at [facility] on November 03, 2021 which did not show any acute abnormalities.  It was recommended to repeat CT Abdo/pelvis with contrast  SH-her Sister [name] is RN Past Medical History:  Diagnosis Date   Allergic rhinitis    Anemia    Anxiety    pt declines anxiety   Arthritis    COPD (chronic obstructive pulmonary disease) ([place])    pt declines having copd   Depression    GERD (gastroesophageal reflux disease)    Headache    migraines   Hypertension    Migraines    Subdural hematoma ([place])    Traumatic brain injury [facility])     Past Surgical History:  Procedure Laterality Date   BRAIN SURGERY     2012 for brain bleed   BREAST ENHANCEMENT SURGERY     EUS N/A 06/06/2017   Procedure: UPPER ENDOSCOPIC ULTRASOUND (EUS) RADIAL;  Surgeon: Milus Banister, MD;  Location: [facility] ENDOSCOPY;  Service: Endoscopy;  Laterality: N/A;    Family History  Problem Relation Age of Onset   Cancer Mother        Spindle Cell   Prostate cancer Father     Social History   Tobacco Use   Smoking status: Every Day    Packs/day: 0.75    Years: 13.00    Total pack years: 9.75    Types: Cigarettes   Smokeless tobacco: Never  Vaping Use   Vaping Use: Never used  Substance Use Topics   Alcohol use: No    Comment:  none in 4-5 years   Drug use: No    Current Outpatient Medications  Medication Sig Dispense Refill   azelastine (ASTELIN) 0.1 % nasal spray USE 2 SPRAYS IN EACH NOSTRIL TWICE (2) DAILY AS DIRECTED 30 mL 0   butalbital-acetaminophen-caffeine (FIORICET, ESGIC) 50-325-40 MG tablet Take 1 tablet by mouth every 4 (four) hours as needed for migraine or headache.  0   Calcium Carb-Cholecalciferol (CALCIUM+D3) 600-800 MG-UNIT TABS Take 1 tablet by mouth 2 (two) times daily.     Carbinoxamine Maleate (RYVENT) 6 MG TABS Take 1 tablet by mouth 2 (two) times daily. 60 tablet 5   EMBEDA 80-3.2 MG CPCR Take 1 capsule by  mouth daily.  0   fentaNYL (DURAGESIC) 25 MCG/HR Place 1 patch onto the skin every 3 (three) days.     fluticasone (FLONASE) 50 MCG/ACT nasal spray      gabapentin (NEURONTIN) 800 MG tablet Take 800 mg by mouth 3 (three) times daily.     levocetirizine (XYZAL) 5 MG tablet Take 5 mg by mouth every evening.  3   lidocaine (LMX) 4 % cream Apply 1 application topically 3 (three) times daily as needed (back pain).     lisinopril (PRINIVIL,ZESTRIL) 10 MG tablet Take 10 mg by mouth daily.     morphine (MSIR) 15 MG tablet Take 15 mg by mouth 2 (two) times daily.     Multiple Vitamin (MULTIVITAMIN) capsule Take 1 capsule by mouth daily.     Omega-3 Fatty Acids (FISH OIL) 1000 MG CAPS Take 1,000 mg by mouth daily.     omeprazole (PRILOSEC) 40 MG capsule Take 40 mg by mouth 2 (two) times daily.     ondansetron (ZOFRAN-ODT) 4 MG disintegrating tablet Take 4 mg by mouth every 8 (eight) hours as needed for nausea or vomiting.     oxybutynin (DITROPAN-XL) 10 MG 24 hr tablet Take 10 mg by mouth daily.  3   phenylephrine (SUDAFED PE) 10 MG TABS tablet Take by mouth.     Polyethylene Glycol 3350 (PEG 3350) POWD      promethazine (PHENERGAN) 25 MG tablet Take 25 mg by mouth every 6 (six) hours as needed for nausea or vomiting.     propranolol (INDERAL) 10 MG tablet Take 10 mg by mouth 3 (three) times daily.     SUMAtriptan (IMITREX) 100 MG tablet Take 100 mg by mouth every 2 (two) hours as needed for migraine. May repeat in 2 hours if headache persists or recurs.     SYMPROIC 0.2 MG TABS Take 0.2 mg by mouth as needed.  2   topiramate (TOPAMAX) 100 MG tablet Take 1 tablet by mouth 2 (two) times daily.  2   triamcinolone (KENALOG) 0.1 % paste Use as directed 1 application in the mouth or throat 4 (four) times daily. Mouth ulcer  1   alendronate (FOSAMAX) 70 MG tablet Take 70 mg by mouth once a week. Take with a full glass of water on an empty stomach.  Tuesdays (Patient not taking: Reported on 02/23/2022)      meloxicam (MOBIC) 7.5 MG tablet Take 7.5 mg by mouth daily. (Patient not taking: Reported on 02/23/2022)     No current facility-administered medications for this visit.    Allergies  Allergen Reactions   Penicillins     Has patient had a PCN reaction causing immediate rash, facial/tongue/throat swelling, SOB or lightheadedness with hypotension: no Has patient had a PCN reaction causing severe rash involving mucus membranes or skin necrosis:  no Has patient had a PCN reaction that required hospitalization: no Has patient had a PCN reaction occurring within the last 10 years: no If all of the above answers are "NO", then may proceed with Cephalosporin use.    Bactrim [Sulfamethoxazole-Trimethoprim] Rash    Rash all over    Review of Systems:  Constitutional: Denies fever, chills, diaphoresis, appetite change and has fatigue.  HEENT: Denies photophobia, eye pain, redness, hearing loss, ear pain, congestion, sore throat, rhinorrhea, sneezing, mouth sores, neck pain, neck stiffness and tinnitus.   Respiratory: Denies SOB, DOE, cough, chest tightness,  and wheezing.   Cardiovascular: Denies chest pain, palpitations and leg swelling.  Genitourinary: Denies dysuria, urgency, frequency, hematuria, flank pain and difficulty urinating.  Musculoskeletal: Has myalgias, back pain, joint swelling, arthralgias and gait problem.  Skin: No rash.  Neurological: Denies dizziness, seizures, syncope, weakness, light-headedness, numbness and headaches.  Hematological: Denies adenopathy. Easy bruising, personal or family bleeding history  Psychiatric/Behavioral: Has anxiety or depression     Physical Exam:    BP 138/62   Pulse 85   Ht 5' (1.524 m)   Wt 115 lb 2 oz (52.2 kg)   BMI 22.48 kg/m  Wt Readings from Last 3 Encounters:  02/23/22 115 lb 2 oz (52.2 kg)  01/07/18 94 lb 3.2 oz (42.7 kg)  06/06/17 94 lb 5 oz (42.8 kg)   Constitutional:  Well-developed, in no acute distress. Psychiatric:  Normal mood and affect. Behavior is normal. HEENT: Pupils normal.  Conjunctivae are normal. No scleral icterus. Cardiovascular: Normal rate, regular rhythm. No edema Pulmonary/chest: Effort normal and breath sounds decreased.  No wheezing, rales or rhonchi. Abdominal: Soft, nondistended.  Lower abdominal tenderness.  No rebound.  Bowel sounds active throughout. There are no masses palpable. No hepatomegaly. Rectal: Deferred Neurological: Alert and oriented to person place and time. Skin: Skin is warm and dry. No rashes noted.  Data Reviewed: I have personally reviewed following labs and imaging studies  CBC:    Latest Ref Rng & Units 05/26/2008    3:04 PM  CBC  WBC 4.0 - 10.5 K/uL 5.4   Hemoglobin 12.0 - 15.0 g/dL 12.6   Hematocrit 36.0 - 46.0 % 36.2   Platelets 150 - 400 K/uL 132     CMP:    Latest Ref Rng & Units 05/26/2008    3:04 PM  CMP  Glucose 70 - 99 mg/dL 106   BUN 6 - 23 mg/dL 3   Creatinine 0.4 - 1.2 mg/dL 0.59   Sodium 135 - 145 mEq/L 135   Potassium 3.5 - 5.1 mEq/L 3.1   Chloride 96 - 112 mEq/L 105   CO2 19 - 32 mEq/L 19   Calcium 8.4 - 10.5 mg/dL 9.5   Total Protein 6.0 - 8.3 g/dL 6.8   Total Bilirubin 0.3 - 1.2 mg/dL 1.0   Alkaline Phos 39 - 117 U/L 115   AST 0 - 37 U/L 40   ALT 0 - 35 U/L 16     GFR: CrCl cannot be calculated (Patient's most recent lab result is older than the maximum 21 days allowed.). Liver Function Tests: No results for input(s): "AST", "ALT", "ALKPHOS", "BILITOT", "PROT", "ALBUMIN" in the last 168 hours. No results for input(s): "LIPASE", "AMYLASE" in the last 168 hours. No results for input(s): "AMMONIA" in the last 168 hours. Coagulation Profile: No results for input(s): "INR", "PROTIME" in the last 168 hours. HbA1C: No results for input(s): "HGBA1C" in the last 72 hours. Lipid Profile:  No results for input(s): "CHOL", "HDL", "LDLCALC", "TRIG", "CHOLHDL", "LDLDIRECT" in the last 72 hours. Thyroid Function Tests: No results  for input(s): "TSH", "T4TOTAL", "FREET4", "T3FREE", "THYROIDAB" in the last 72 hours. Anemia Panel: No results for input(s): "VITAMINB12", "FOLATE", "FERRITIN", "TIBC", "IRON", "RETICCTPCT" in the last 72 hours.  No results found for this or any previous visit (from the past 240 hour(s)).    Radiology Studies: No results found.    Carmell Austria, MD 02/23/2022, 11:22 AM  Cc: Townsend Roger, MD

## 2022-02-23 NOTE — Patient Instructions (Signed)
_______________________________________________________  If you are age 57 or older, your body mass index should be between 23-30. Your Body mass index is 22.48 kg/m. If this is out of the aforementioned range listed, please consider follow up with your Primary Care Provider.  If you are age 81 or younger, your body mass index should be between 19-25. Your Body mass index is 22.48 kg/m. If this is out of the aformentioned range listed, please consider follow up with your Primary Care Provider.   ________________________________________________________  The [facility] providers would like to encourage you to use [facility] to communicate with providers for non-urgent requests or questions.  Due to long hold times on the telephone, sending your provider a message by [facility] may be a faster and more efficient way to get a response.  Please allow 48 business hours for a response.  Please remember that this is for non-urgent requests.  _______________________________________________________  Your provider has requested that you go to the basement level for lab work before leaving today. Press "B" on the elevator. The lab is located at the first door on the left as you exit the elevator.  Continue current medications  Two days before your procedure: Mix 3 packs (or capfuls) of Miralax in 48 ounces of clear liquid and drink at 6pm.  We have sent the following medications to your pharmacy for you to pick up at your convenience: [place] have been scheduled for an endoscopy and colonoscopy. Please follow the written instructions given to you at your visit today. Please pick up your prep supplies at the pharmacy within the next 1-3 days. If you use inhalers (even only as needed), please bring them with you on the day of your procedure.   You have been scheduled for a CT scan of the abdomen and pelvis at [facility] ([address], [place], [place] [address]).   You are scheduled on  03-08-2022 at 1230pm. You should arrive at 1030am your appointment time for registration. Please follow the written instructions below on the day of your exam:  WARNING: IF YOU ARE ALLERGIC TO IODINE/X-RAY DYE, PLEASE NOTIFY RADIOLOGY IMMEDIATELY AT [phone]! YOU WILL BE GIVEN A 13 HOUR PREMEDICATION PREP.  1) Do not eat or drink anything after 830am (4 hours prior to your test) 2) You have been given 2 bottles of oral contrast to drink. The solution may taste better if refrigerated, but do NOT add ice or any other liquid to this solution. Shake well before drinking.    Drink 1 bottle of contrast @       (2 hours prior to your exam)  Drink 1 bottle of contrast @       (1 hour prior to your exam)  You may take any medications as prescribed with a small amount of water, if necessary. If you take any of the following medications: METFORMIN, GLUCOPHAGE, GLUCOVANCE, AVANDAMET, RIOMET, FORTAMET, [place] MET, JANUMET, GLUMETZA or METAGLIP, you MAY be asked to HOLD this medication 48 hours AFTER the exam.  The purpose of you drinking the oral contrast is to aid in the visualization of your intestinal tract. The contrast solution may cause some diarrhea. Depending on your individual set of symptoms, you may also receive an intravenous injection of x-ray contrast/dye. Plan on being at [facility] for 30 minutes or longer, depending on the type of exam you are having performed.  This test typically takes 30-45 minutes to complete.  If you have any questions regarding your  exam or if you need to reschedule, you may call the CT department at [phone] between the hours of 8:00 am and 5:00 pm, Monday-Friday.  ________________________________________________________________________  Thank you,  Dr. Jackquline Denmark

## 2022-02-28 ENCOUNTER — Telehealth: Payer: Self-pay | Admitting: Gastroenterology

## 2022-02-28 NOTE — Telephone Encounter (Signed)
Patient called states she was scheduled at [place] for a CT scan and she canceled it because she would like to get it done over at [facility] instead. Please advise.

## 2022-03-01 NOTE — Telephone Encounter (Signed)
CT order has been printed and faxed to [facility] (P: [phone], F: [phone]).  Called and left patient a detailed vm letting her know that we have faxed the order as requested. I informed her that [facility] central scheduling should contact her to schedule but she can give them a call if she wishes. I provided pt with the phone # to central scheduling [phone], option &). I advised pt to call back if she had any questions or concerns.

## 2022-03-07 DIAGNOSIS — K219 Gastro-esophageal reflux disease without esophagitis: Secondary | ICD-10-CM | POA: Diagnosis not present

## 2022-03-07 DIAGNOSIS — K5903 Drug induced constipation: Secondary | ICD-10-CM | POA: Diagnosis not present

## 2022-03-07 DIAGNOSIS — R140 Abdominal distension (gaseous): Secondary | ICD-10-CM | POA: Diagnosis not present

## 2022-03-07 DIAGNOSIS — R109 Unspecified abdominal pain: Secondary | ICD-10-CM | POA: Diagnosis not present

## 2022-03-07 DIAGNOSIS — K838 Other specified diseases of biliary tract: Secondary | ICD-10-CM | POA: Diagnosis not present

## 2022-03-07 DIAGNOSIS — T402X5A Adverse effect of other opioids, initial encounter: Secondary | ICD-10-CM | POA: Diagnosis not present

## 2022-03-08 ENCOUNTER — Ambulatory Visit (HOSPITAL_COMMUNITY): Payer: Medicare Other

## 2022-03-08 DIAGNOSIS — Z8781 Personal history of (healed) traumatic fracture: Secondary | ICD-10-CM | POA: Diagnosis not present

## 2022-03-08 DIAGNOSIS — M545 Low back pain, unspecified: Secondary | ICD-10-CM | POA: Diagnosis not present

## 2022-03-08 DIAGNOSIS — M47816 Spondylosis without myelopathy or radiculopathy, lumbar region: Secondary | ICD-10-CM | POA: Diagnosis not present

## 2022-03-08 DIAGNOSIS — G894 Chronic pain syndrome: Secondary | ICD-10-CM | POA: Diagnosis not present

## 2022-03-08 DIAGNOSIS — M5136 Other intervertebral disc degeneration, lumbar region: Secondary | ICD-10-CM | POA: Diagnosis not present

## 2022-03-08 DIAGNOSIS — M5416 Radiculopathy, lumbar region: Secondary | ICD-10-CM | POA: Diagnosis not present

## 2022-03-08 DIAGNOSIS — M48061 Spinal stenosis, lumbar region without neurogenic claudication: Secondary | ICD-10-CM | POA: Diagnosis not present

## 2022-03-08 DIAGNOSIS — R208 Other disturbances of skin sensation: Secondary | ICD-10-CM | POA: Diagnosis not present

## 2022-03-08 DIAGNOSIS — K5903 Drug induced constipation: Secondary | ICD-10-CM | POA: Diagnosis not present

## 2022-03-08 DIAGNOSIS — Z79891 Long term (current) use of opiate analgesic: Secondary | ICD-10-CM | POA: Diagnosis not present

## 2022-03-12 ENCOUNTER — Telehealth: Payer: Self-pay | Admitting: Gastroenterology

## 2022-03-12 NOTE — Telephone Encounter (Signed)
[facility] radiology contacted and spoke to [place] who gave verbal Findings off of recent CT scan Abd/Pelvis done at [facility]: " Spiculated Right lower Lobe Nodule along Pleural surface, appears to have contracted slightly since July; could be scarring from prior pneumonia , Pulmonary infarct or less likely neoplasm; PET may be beneficial: question stone in distal Common bile duct"   [facility] hospital was contacted and requested that CT  report be  faxed over: Report was received: Copies made and placed in Dr. Lyndel Safe in box and one copy sent to be scanned into Epic:

## 2022-03-12 NOTE — Telephone Encounter (Signed)
Inbound call from [place] with [facility] Radiology with a call back number at [phone]. With a call report on patient . Please advise.

## 2022-03-13 ENCOUNTER — Other Ambulatory Visit: Payer: Self-pay

## 2022-03-13 DIAGNOSIS — R109 Unspecified abdominal pain: Secondary | ICD-10-CM

## 2022-03-13 DIAGNOSIS — R140 Abdominal distension (gaseous): Secondary | ICD-10-CM

## 2022-03-13 DIAGNOSIS — K8020 Calculus of gallbladder without cholecystitis without obstruction: Secondary | ICD-10-CM

## 2022-03-13 NOTE — Telephone Encounter (Signed)
Orders for labs faxed to [facility] Lab: Faxed To [phone] Orders for MRI/MRCP fax to [facility] Radiology: Faxed to [phone] Pt made aware Pt verbalized understanding with all questions answered.

## 2022-03-13 NOTE — Telephone Encounter (Signed)
CT showing -small pulm nodule (she needs to get in touch with Dr Mellody Memos - her PCP) -small ?stone in CBD   Plan -CBC, CMP, lipase -MRCP with contrast RG

## 2022-03-13 NOTE — Telephone Encounter (Signed)
Pt made aware of recent results and Dr. Lyndel Safe recommendations: small pulm nodule Pt notified she needs to get in touch with Dr Mellody Memos - her PCP   Orders for labs and MRCP placed in EPIC:  Message was sent to radiology schedulers to schedule MRCP:  Pt stated that she cannot drive to [place] and needs labs and MRCP sent to [place]: [facility]

## 2022-03-30 ENCOUNTER — Encounter: Payer: Self-pay | Admitting: Gastroenterology

## 2022-04-02 ENCOUNTER — Encounter: Payer: Self-pay | Admitting: Certified Registered Nurse Anesthetist

## 2022-04-06 ENCOUNTER — Other Ambulatory Visit: Payer: Self-pay

## 2022-04-06 DIAGNOSIS — G8929 Other chronic pain: Secondary | ICD-10-CM | POA: Diagnosis not present

## 2022-04-06 DIAGNOSIS — M79671 Pain in right foot: Secondary | ICD-10-CM | POA: Diagnosis not present

## 2022-04-06 DIAGNOSIS — M25531 Pain in right wrist: Secondary | ICD-10-CM | POA: Diagnosis not present

## 2022-04-06 MED ORDER — LORATADINE 10 MG PO TABS: 10.0000 mg | ORAL_TABLET | Freq: Every day | ORAL | 0 refills | Status: DC

## 2022-04-06 MED ORDER — ONDANSETRON 4 MG PO TBDP: 4.0000 mg | ORAL_TABLET | Freq: Four times a day (QID) | ORAL | 0 refills | Status: DC | PRN

## 2022-04-09 ENCOUNTER — Encounter: Payer: Medicare Other | Admitting: Gastroenterology

## 2022-04-12 DIAGNOSIS — M5416 Radiculopathy, lumbar region: Secondary | ICD-10-CM | POA: Diagnosis not present

## 2022-04-12 DIAGNOSIS — Z8781 Personal history of (healed) traumatic fracture: Secondary | ICD-10-CM | POA: Diagnosis not present

## 2022-04-12 DIAGNOSIS — R208 Other disturbances of skin sensation: Secondary | ICD-10-CM | POA: Diagnosis not present

## 2022-04-12 DIAGNOSIS — M5136 Other intervertebral disc degeneration, lumbar region: Secondary | ICD-10-CM | POA: Diagnosis not present

## 2022-04-12 DIAGNOSIS — M47816 Spondylosis without myelopathy or radiculopathy, lumbar region: Secondary | ICD-10-CM | POA: Diagnosis not present

## 2022-04-12 DIAGNOSIS — M545 Low back pain, unspecified: Secondary | ICD-10-CM | POA: Diagnosis not present

## 2022-04-12 DIAGNOSIS — K5903 Drug induced constipation: Secondary | ICD-10-CM | POA: Diagnosis not present

## 2022-04-12 DIAGNOSIS — M48061 Spinal stenosis, lumbar region without neurogenic claudication: Secondary | ICD-10-CM | POA: Diagnosis not present

## 2022-04-16 ENCOUNTER — Ambulatory Visit: Payer: Medicare Other | Admitting: Internal Medicine

## 2022-04-27 ENCOUNTER — Encounter: Payer: Self-pay | Admitting: Internal Medicine

## 2022-04-27 ENCOUNTER — Ambulatory Visit: Payer: Medicare Other | Admitting: Internal Medicine

## 2022-04-27 VITALS — BP 124/88 | HR 108 | Resp 18 | Ht 60.0 in | Wt 113.4 lb

## 2022-04-27 DIAGNOSIS — J208 Acute bronchitis due to other specified organisms: Secondary | ICD-10-CM | POA: Insufficient documentation

## 2022-04-27 MED ORDER — AZITHROMYCIN 250 MG PO TABS: ORAL_TABLET | ORAL | 0 refills | Status: AC

## 2022-04-27 NOTE — Progress Notes (Signed)
   Acute Office Visit  Subjective:     Patient ID: [name], female    DOB: November 16, 1965, 56 y.o.   MRN: [id number]  Chief Complaint  Patient presents with   Cough    Cough, congestion,    Cough Pertinent negatives include no chills or fever.   Patient is in today for cough and nasal congestion for 11 days, she has her symptoms are nit getting any better. She has sore throat before but that is better now. She says she cough all the time, day and night. She says she is not bring any flame out but she seems congested.  No fver or chills. Her COVID test is negative.   Review of Systems  Constitutional:  Positive for malaise/fatigue. Negative for chills and fever.  HENT:  Positive for congestion.   Respiratory:  Positive for cough.   Cardiovascular: Negative.         Objective:    BP 124/88 (BP Location: Left Arm, Patient Position: Sitting, Cuff Size: Normal)   Pulse (!) 108   Resp 18   Ht 5' (1.524 m)   Wt 113 lb 6 oz (51.4 kg)   SpO2 94%   BMI 22.14 kg/m    Physical Exam Constitutional:      Appearance: Normal appearance.  Cardiovascular:     Rate and Rhythm: Normal rate and regular rhythm.     Heart sounds: Normal heart sounds.  Pulmonary:     Effort: Pulmonary effort is normal.     Breath sounds: Wheezing present.  Neurological:     Mental Status: She is alert.     No results found for any visits on 04/27/22.      Assessment & Plan:   Problem List Items Addressed This Visit       Respiratory   Acute bronchitis due to other specified organisms - Primary    She is wheezing, and her symptoms are for 10 days so will give her zithromax for 5 days. She will call if she is not better then will do cxray.       Relevant Medications   azithromycin (ZITHROMAX Z-PAK) 250 MG tablet    Meds ordered this encounter  Medications   azithromycin (ZITHROMAX Z-PAK) 250 MG tablet    Sig: Take 2 tablets (500 mg) on  Day 1,  followed by 1 tablet (250 mg) once  daily on Days 2 through 5.    Dispense:  6 each    Refill:  0    No follow-ups on file.  Garwin Brothers, MD

## 2022-04-27 NOTE — Assessment & Plan Note (Signed)
She is wheezing, and her symptoms are for 10 days so will give her zithromax for 5 days. She will call if she is not better then will do cxray.

## 2022-05-01 ENCOUNTER — Other Ambulatory Visit: Payer: Self-pay

## 2022-05-01 MED ORDER — SUMATRIPTAN SUCCINATE 100 MG PO TABS: 100.0000 mg | ORAL_TABLET | ORAL | 0 refills | Status: DC

## 2022-05-09 ENCOUNTER — Ambulatory Visit: Payer: Medicare Other | Admitting: Internal Medicine

## 2022-05-10 DIAGNOSIS — M5136 Other intervertebral disc degeneration, lumbar region: Secondary | ICD-10-CM | POA: Diagnosis not present

## 2022-05-10 DIAGNOSIS — R208 Other disturbances of skin sensation: Secondary | ICD-10-CM | POA: Diagnosis not present

## 2022-05-10 DIAGNOSIS — K5903 Drug induced constipation: Secondary | ICD-10-CM | POA: Diagnosis not present

## 2022-05-10 DIAGNOSIS — M47816 Spondylosis without myelopathy or radiculopathy, lumbar region: Secondary | ICD-10-CM | POA: Diagnosis not present

## 2022-05-10 DIAGNOSIS — M48061 Spinal stenosis, lumbar region without neurogenic claudication: Secondary | ICD-10-CM | POA: Diagnosis not present

## 2022-05-10 DIAGNOSIS — M5416 Radiculopathy, lumbar region: Secondary | ICD-10-CM | POA: Diagnosis not present

## 2022-05-10 DIAGNOSIS — M545 Low back pain, unspecified: Secondary | ICD-10-CM | POA: Diagnosis not present

## 2022-05-10 DIAGNOSIS — Z8781 Personal history of (healed) traumatic fracture: Secondary | ICD-10-CM | POA: Diagnosis not present

## 2022-05-23 ENCOUNTER — Telehealth: Payer: Self-pay | Admitting: *Deleted

## 2022-05-23 NOTE — Progress Notes (Unsigned)
  Care Coordination  Outreach Note  05/23/2022 Name: [name] MRN: [id number] DOB: 1965/07/06   Care Coordination Outreach Attempts: An unsuccessful telephone outreach was attempted today to offer the patient information about available care coordination services as a benefit of their health plan.   Follow Up Plan:  Additional outreach attempts will be made to offer the patient care coordination information and services.   Encounter Outcome:  No Answer  Julian Hy, [place] Direct Dial: [phone]

## 2022-05-24 NOTE — Progress Notes (Signed)
  Care Coordination   Note   05/24/2022 Name: [name] MRN: [id number] DOB: 1965/11/28  [name] is a 57 y.o. year old female who sees Townsend Roger, MD for primary care. I reached out to Charmayne Sheer by phone today to offer care coordination services.  Ms. Sledd was given information about Care Coordination services today including:   The Care Coordination services include support from the care team which includes your Nurse Coordinator, Clinical Social Worker, or Pharmacist.  The Care Coordination team is here to help remove barriers to the health concerns and goals most important to you. Care Coordination services are voluntary, and the patient may decline or stop services at any time by request to their care team member.   Care Coordination Consent Status: Patient agreed to services and verbal consent obtained.   Follow up plan:  Telephone appointment with care coordination team member scheduled for:  07/18/2022  Encounter Outcome:  Pt. Scheduled  Julian Hy, [place] Direct Dial: [phone]

## 2022-05-30 ENCOUNTER — Encounter: Payer: Self-pay | Admitting: Internal Medicine

## 2022-05-30 ENCOUNTER — Ambulatory Visit: Payer: Medicare Other | Admitting: Internal Medicine

## 2022-05-30 VITALS — BP 124/88 | HR 77 | Temp 97.2°F | Resp 18 | Ht 60.0 in | Wt 111.0 lb

## 2022-05-30 DIAGNOSIS — G894 Chronic pain syndrome: Secondary | ICD-10-CM | POA: Diagnosis not present

## 2022-05-30 DIAGNOSIS — G43709 Chronic migraine without aura, not intractable, without status migrainosus: Secondary | ICD-10-CM | POA: Diagnosis not present

## 2022-05-30 MED ORDER — LISINOPRIL 10 MG PO TABS: 10.0000 mg | ORAL_TABLET | Freq: Every day | ORAL | 3 refills | Status: DC

## 2022-05-30 MED ORDER — QUVIVIQ 50 MG PO TABS: 1.0000 | ORAL_TABLET | Freq: Every day | ORAL | 1 refills | Status: DC

## 2022-05-30 MED ORDER — LORATADINE 10 MG PO TABS: 10.0000 mg | ORAL_TABLET | Freq: Every day | ORAL | 0 refills | Status: DC

## 2022-05-30 MED ORDER — TOPIRAMATE 100 MG PO TABS: 150.0000 mg | ORAL_TABLET | Freq: Two times a day (BID) | ORAL | 2 refills | Status: DC

## 2022-05-30 MED ORDER — PROMETHAZINE HCL 25 MG PO TABS: 25.0000 mg | ORAL_TABLET | Freq: Four times a day (QID) | ORAL | 0 refills | Status: DC | PRN

## 2022-05-30 MED ORDER — NURTEC 75 MG PO TBDP: 1.0000 | ORAL_TABLET | Freq: Every day | ORAL | 3 refills | Status: DC

## 2022-05-30 MED ORDER — UBRELVY 100 MG PO TABS: 1.0000 | ORAL_TABLET | Freq: Two times a day (BID) | ORAL | 2 refills | Status: DC | PRN

## 2022-05-30 NOTE — Assessment & Plan Note (Signed)
I am concerned about her sumatriptan use and that it could be causing rebound effect with using it every other day.  She states that she does better with nurtec and I am going to place her on prophylaxis therapy with daily nurtec '75mg'$  daily and continue on her topamax '150mg'$  BID.  She can use ubrelvy for any abortive migraines.

## 2022-05-30 NOTE — Assessment & Plan Note (Signed)
I reviewed her notes from [facility] and she will continue her meds as directed.
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
## 2022-06-07 DIAGNOSIS — M48061 Spinal stenosis, lumbar region without neurogenic claudication: Secondary | ICD-10-CM | POA: Diagnosis not present

## 2022-06-07 DIAGNOSIS — M5136 Other intervertebral disc degeneration, lumbar region: Secondary | ICD-10-CM | POA: Diagnosis not present

## 2022-06-07 DIAGNOSIS — Z79891 Long term (current) use of opiate analgesic: Secondary | ICD-10-CM | POA: Diagnosis not present

## 2022-06-07 DIAGNOSIS — R208 Other disturbances of skin sensation: Secondary | ICD-10-CM | POA: Diagnosis not present

## 2022-06-07 DIAGNOSIS — M5416 Radiculopathy, lumbar region: Secondary | ICD-10-CM | POA: Diagnosis not present

## 2022-06-07 DIAGNOSIS — Z8781 Personal history of (healed) traumatic fracture: Secondary | ICD-10-CM | POA: Diagnosis not present

## 2022-06-07 DIAGNOSIS — M47816 Spondylosis without myelopathy or radiculopathy, lumbar region: Secondary | ICD-10-CM | POA: Diagnosis not present

## 2022-06-07 DIAGNOSIS — G894 Chronic pain syndrome: Secondary | ICD-10-CM | POA: Diagnosis not present

## 2022-06-07 DIAGNOSIS — M545 Low back pain, unspecified: Secondary | ICD-10-CM | POA: Diagnosis not present

## 2022-06-07 DIAGNOSIS — K5903 Drug induced constipation: Secondary | ICD-10-CM | POA: Diagnosis not present

## 2022-06-08 ENCOUNTER — Other Ambulatory Visit: Payer: Self-pay | Admitting: Internal Medicine

## 2022-06-08 MED ORDER — AIMOVIG 140 MG/ML ~~LOC~~ SOAJ: 140.0000 mg | SUBCUTANEOUS | 5 refills | Status: DC

## 2022-07-06 ENCOUNTER — Other Ambulatory Visit: Payer: Self-pay

## 2022-07-06 MED ORDER — OXYBUTYNIN CHLORIDE ER 10 MG PO TB24: 10.0000 mg | ORAL_TABLET | Freq: Every day | ORAL | 0 refills | Status: DC

## 2022-07-12 DIAGNOSIS — Z8781 Personal history of (healed) traumatic fracture: Secondary | ICD-10-CM | POA: Diagnosis not present

## 2022-07-12 DIAGNOSIS — M5416 Radiculopathy, lumbar region: Secondary | ICD-10-CM | POA: Diagnosis not present

## 2022-07-12 DIAGNOSIS — Z79891 Long term (current) use of opiate analgesic: Secondary | ICD-10-CM | POA: Diagnosis not present

## 2022-07-12 DIAGNOSIS — M545 Low back pain, unspecified: Secondary | ICD-10-CM | POA: Diagnosis not present

## 2022-07-12 DIAGNOSIS — M5136 Other intervertebral disc degeneration, lumbar region: Secondary | ICD-10-CM | POA: Diagnosis not present

## 2022-07-12 DIAGNOSIS — K5903 Drug induced constipation: Secondary | ICD-10-CM | POA: Diagnosis not present

## 2022-07-12 DIAGNOSIS — R208 Other disturbances of skin sensation: Secondary | ICD-10-CM | POA: Diagnosis not present

## 2022-07-12 DIAGNOSIS — M48061 Spinal stenosis, lumbar region without neurogenic claudication: Secondary | ICD-10-CM | POA: Diagnosis not present

## 2022-07-18 ENCOUNTER — Ambulatory Visit: Payer: Self-pay

## 2022-07-18 NOTE — Patient Outreach (Signed)
  Care Coordination   Initial Visit Note   07/18/2022 Name: [name] MRN: [id number] DOB: October 20, 1965  [name] is a 57 y.o. year old female who sees Townsend Roger, MD for primary care. I spoke with  Charmayne Sheer by phone today.  What matters to the patients health and wellness today?  Reports that her biggest concern is her back and knee pain.  Currently goes to pain specialist monthly.  Currently takes medications as prescribed.  Sees ortho needs knee replacement but not a candidate.  Reports she just has to live with the pain.   Uses walker all the time.  Reports balance issues.  Lives alone.  Has cleaning person who comes every two weeks. Has a walk in shower and able to manage dressing independently.   Reports she uses the microwave for meals.   Has a dog that keeps her company.  Orders her groceries on line.  Has 2 sons and a sister in law who assist her.   Goals Addressed   None     SDOH assessments and interventions completed:  Yes  SDOH Interventions Today    Flowsheet Row Most Recent Value  SDOH Interventions   Food Insecurity Interventions Intervention Not Indicated  Housing Interventions Intervention Not Indicated  Transportation Interventions Intervention Not Indicated  Utilities Interventions Intervention Not Indicated  Alcohol Usage Interventions Intervention Not Indicated (Score <7)  Financial Strain Interventions Intervention Not Indicated  Physical Activity Interventions Other (Comments)  [unable due to previous head injury]  Social Connections Interventions Other (Comment)  [Feels like she ok.  Text family and friends.]        Care Coordination Interventions:  Yes, provided   Follow up plan: Follow up call scheduled for 08/20/2022    Encounter Outcome:  Pt. Visit Completed   Tomasa Rand, RN, BSN, CEN [place] Coordinator [phone]

## 2022-07-30 DIAGNOSIS — M818 Other osteoporosis without current pathological fracture: Secondary | ICD-10-CM | POA: Insufficient documentation

## 2022-08-07 DIAGNOSIS — G8929 Other chronic pain: Secondary | ICD-10-CM | POA: Diagnosis not present

## 2022-08-07 DIAGNOSIS — M25531 Pain in right wrist: Secondary | ICD-10-CM | POA: Diagnosis not present

## 2022-08-16 DIAGNOSIS — M48061 Spinal stenosis, lumbar region without neurogenic claudication: Secondary | ICD-10-CM | POA: Diagnosis not present

## 2022-08-16 DIAGNOSIS — Z8781 Personal history of (healed) traumatic fracture: Secondary | ICD-10-CM | POA: Diagnosis not present

## 2022-08-16 DIAGNOSIS — M5136 Other intervertebral disc degeneration, lumbar region: Secondary | ICD-10-CM | POA: Diagnosis not present

## 2022-08-16 DIAGNOSIS — R208 Other disturbances of skin sensation: Secondary | ICD-10-CM | POA: Diagnosis not present

## 2022-08-16 DIAGNOSIS — K5903 Drug induced constipation: Secondary | ICD-10-CM | POA: Diagnosis not present

## 2022-08-16 DIAGNOSIS — Z79891 Long term (current) use of opiate analgesic: Secondary | ICD-10-CM | POA: Diagnosis not present

## 2022-08-16 DIAGNOSIS — M545 Low back pain, unspecified: Secondary | ICD-10-CM | POA: Diagnosis not present

## 2022-08-16 DIAGNOSIS — M5416 Radiculopathy, lumbar region: Secondary | ICD-10-CM | POA: Diagnosis not present

## 2022-08-20 ENCOUNTER — Ambulatory Visit: Payer: Self-pay

## 2022-08-20 NOTE — Patient Outreach (Signed)
  Care Coordination   Follow Up Visit Note   08/20/2022 Name: [name] MRN: [id number] DOB: 11/24/1965  [name] is a 57 y.o. year old female who sees Crist Fat, MD for primary care. I spoke with  Doyle Askew by phone today.  What matters to the patients health and wellness today?  Follow up call with patient today. Patient reports that she is doing well. Reports no falls since our last telephone call. Reports she got the exercise packet in the mail and has been doing the exercises a few times per week.  Patient reports that she is going this week to get a cortisone injection in her knee.  Reports pain is unchanged. Denies any changes to medications or new probrlsm at this time.     Goals Addressed               This Visit's Progress     Fall prevention (pt-stated)        Interventions Today    Flowsheet Row Most Recent Value  Chronic Disease   Chronic disease during today's visit Other  [falls]  General Interventions   General Interventions Discussed/Reviewed General Interventions Reviewed  Exercise Interventions   Exercise Discussed/Reviewed Exercise Discussed  [assess for falls.  Reviewed [facility] exercise packet. Encouraged patient to do home exervises 5 times per week. Reviewed benefits of better balance and better strength in legs.]  Education Interventions   Education Provided Provided Education  Provided Verbal Education On Exercise  Safety Interventions   Safety Discussed/Reviewed Fall Risk              SDOH assessments and interventions completed:  No     Care Coordination Interventions:  Yes, provided   Follow up plan: Follow up call scheduled for 10/01/2022    Encounter Outcome:  Pt. Visit Completed   Rowe Pavy, RN, BSN, CEN [facility] Coordinator [phone]

## 2022-08-21 ENCOUNTER — Other Ambulatory Visit: Payer: Self-pay

## 2022-08-21 MED ORDER — LORATADINE 10 MG PO TABS: 10.0000 mg | ORAL_TABLET | Freq: Every day | ORAL | 1 refills | Status: DC

## 2022-08-22 DIAGNOSIS — G8929 Other chronic pain: Secondary | ICD-10-CM | POA: Diagnosis not present

## 2022-08-22 DIAGNOSIS — M1712 Unilateral primary osteoarthritis, left knee: Secondary | ICD-10-CM | POA: Diagnosis not present

## 2022-08-22 DIAGNOSIS — M25572 Pain in left ankle and joints of left foot: Secondary | ICD-10-CM | POA: Diagnosis not present

## 2022-08-28 ENCOUNTER — Other Ambulatory Visit: Payer: Self-pay | Admitting: Internal Medicine

## 2022-08-28 ENCOUNTER — Other Ambulatory Visit: Payer: Self-pay

## 2022-08-28 ENCOUNTER — Ambulatory Visit: Payer: Medicare Other | Admitting: Internal Medicine

## 2022-08-28 ENCOUNTER — Encounter: Payer: Self-pay | Admitting: Internal Medicine

## 2022-08-28 VITALS — BP 140/80 | HR 93 | Temp 98.3°F | Resp 16 | Ht 60.0 in | Wt 119.6 lb

## 2022-08-28 DIAGNOSIS — J302 Other seasonal allergic rhinitis: Secondary | ICD-10-CM | POA: Insufficient documentation

## 2022-08-28 DIAGNOSIS — G43709 Chronic migraine without aura, not intractable, without status migrainosus: Secondary | ICD-10-CM | POA: Diagnosis not present

## 2022-08-28 DIAGNOSIS — H26493 Other secondary cataract, bilateral: Secondary | ICD-10-CM | POA: Diagnosis not present

## 2022-08-28 MED ORDER — NURTEC 75 MG PO TBDP: 1.0000 | ORAL_TABLET | ORAL | 5 refills | Status: DC

## 2022-08-28 MED ORDER — UBRELVY 100 MG PO TABS: 1.0000 | ORAL_TABLET | Freq: Two times a day (BID) | ORAL | 2 refills | Status: DC | PRN

## 2022-08-28 MED ORDER — FLUTICASONE PROPIONATE 50 MCG/ACT NA SUSP: 1.0000 | Freq: Two times a day (BID) | NASAL | 2 refills | Status: DC
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
## 2022-08-28 NOTE — Assessment & Plan Note (Signed)
Her migraines were doing well with taking topamax 150 BID and taking nurtec 75mg  every other day where she was having 1-2 migraines per month.  She thinks her allergies worsened her migraines where she is on claritin, astelin nasal spray, and mucinex spray prn. I want her to start on flonase as well.

## 2022-09-07 ENCOUNTER — Other Ambulatory Visit: Payer: Self-pay | Admitting: Internal Medicine

## 2022-09-13 DIAGNOSIS — Z79891 Long term (current) use of opiate analgesic: Secondary | ICD-10-CM | POA: Diagnosis not present

## 2022-09-13 DIAGNOSIS — G894 Chronic pain syndrome: Secondary | ICD-10-CM | POA: Diagnosis not present

## 2022-09-13 DIAGNOSIS — Z1389 Encounter for screening for other disorder: Secondary | ICD-10-CM | POA: Diagnosis not present

## 2022-09-13 DIAGNOSIS — M545 Low back pain, unspecified: Secondary | ICD-10-CM | POA: Diagnosis not present

## 2022-09-13 DIAGNOSIS — R208 Other disturbances of skin sensation: Secondary | ICD-10-CM | POA: Diagnosis not present

## 2022-09-13 DIAGNOSIS — S32049A Unspecified fracture of fourth lumbar vertebra, initial encounter for closed fracture: Secondary | ICD-10-CM | POA: Diagnosis not present

## 2022-09-13 DIAGNOSIS — M5416 Radiculopathy, lumbar region: Secondary | ICD-10-CM | POA: Diagnosis not present

## 2022-09-13 DIAGNOSIS — M47816 Spondylosis without myelopathy or radiculopathy, lumbar region: Secondary | ICD-10-CM | POA: Diagnosis not present

## 2022-09-13 DIAGNOSIS — M48061 Spinal stenosis, lumbar region without neurogenic claudication: Secondary | ICD-10-CM | POA: Diagnosis not present

## 2022-09-13 DIAGNOSIS — Z8781 Personal history of (healed) traumatic fracture: Secondary | ICD-10-CM | POA: Diagnosis not present

## 2022-09-13 DIAGNOSIS — K5903 Drug induced constipation: Secondary | ICD-10-CM | POA: Diagnosis not present

## 2022-09-13 DIAGNOSIS — M5136 Other intervertebral disc degeneration, lumbar region: Secondary | ICD-10-CM | POA: Diagnosis not present

## 2022-09-19 ENCOUNTER — Other Ambulatory Visit: Payer: Self-pay

## 2022-09-19 MED ORDER — QUVIVIQ 50 MG PO TABS: ORAL_TABLET | ORAL | 0 refills | Status: DC

## 2022-10-01 ENCOUNTER — Ambulatory Visit: Payer: Self-pay

## 2022-10-01 NOTE — Patient Outreach (Signed)
  Care Coordination   Follow Up Visit Note   10/01/2022 Name: [name] MRN: [id number] DOB: 1965/09/15  [name] is a 57 y.o. year old female who sees Crist Fat, MD for primary care. I spoke with  Doyle Askew by phone today.  What matters to the patients health and wellness today?  Follow up call with patient today. Patient reports that her back pain is about the same. Reports she continues to have MHA  twice a week. Reports continuous headache for 24 hours.  Reports sometimes she is able to sleep if off.  Reports the nuretec helps.. Reports less sinus congestion.  Reports that she uses ice packs.   Reports pain today of 6/10. No recent falls.  Reports she is exercises.  Unable to tell if she is getting stronger.  Denies any new problems or concerns today.  Reports that she is able to go outside and sit.    Goals Addressed               This Visit's Progress     Fall prevention (pt-stated)        Interventions Today    Flowsheet Row Most Recent Value  Chronic Disease   Chronic disease during today's visit Other  [Headaches and chronic pain.]  General Interventions   General Interventions Discussed/Reviewed General Interventions Reviewed  Exercise Interventions   Exercise Discussed/Reviewed Physical Activity  Physical Activity Discussed/Reviewed Physical Activity Reviewed, Types of exercise  Nutrition Interventions   Nutrition Discussed/Reviewed Nutrition Reviewed  Pharmacy Interventions   Pharmacy Dicussed/Reviewed Medications and their functions  Safety Interventions   Safety Discussed/Reviewed Fall Risk            Provided a listening ear.    SDOH assessments and interventions completed:  No     Care Coordination Interventions:  Yes, provided   Follow up plan: Follow up call scheduled for 12/03/2022    Encounter Outcome:  Pt. Visit Completed   Rowe Pavy, RN, BSN, CEN [facility] Coordinator [phone]

## 2022-10-02 ENCOUNTER — Other Ambulatory Visit: Payer: Self-pay | Admitting: Internal Medicine

## 2022-10-05 ENCOUNTER — Other Ambulatory Visit: Payer: Self-pay

## 2022-10-05 MED ORDER — OXYBUTYNIN CHLORIDE ER 10 MG PO TB24: 10.0000 mg | ORAL_TABLET | Freq: Every day | ORAL | 1 refills | Status: DC

## 2022-10-10 ENCOUNTER — Other Ambulatory Visit: Payer: Self-pay

## 2022-10-10 MED ORDER — OXYBUTYNIN CHLORIDE ER 10 MG PO TB24: 10.0000 mg | ORAL_TABLET | Freq: Every day | ORAL | 1 refills | Status: DC

## 2022-10-15 IMAGING — DX DG LUMBAR SPINE COMPLETE 4+V
5 series · 5 of 5 positions shown · non-contrast
Comparison: CT lumbar spine 10/10/2020

CLINICAL DATA: Patient in wheelchair, wheelchair fell backwards,
increased RIGHT hip and lumbar pain

EXAM:
LUMBAR SPINE - COMPLETE 4+ VIEW

[l-spine ap]
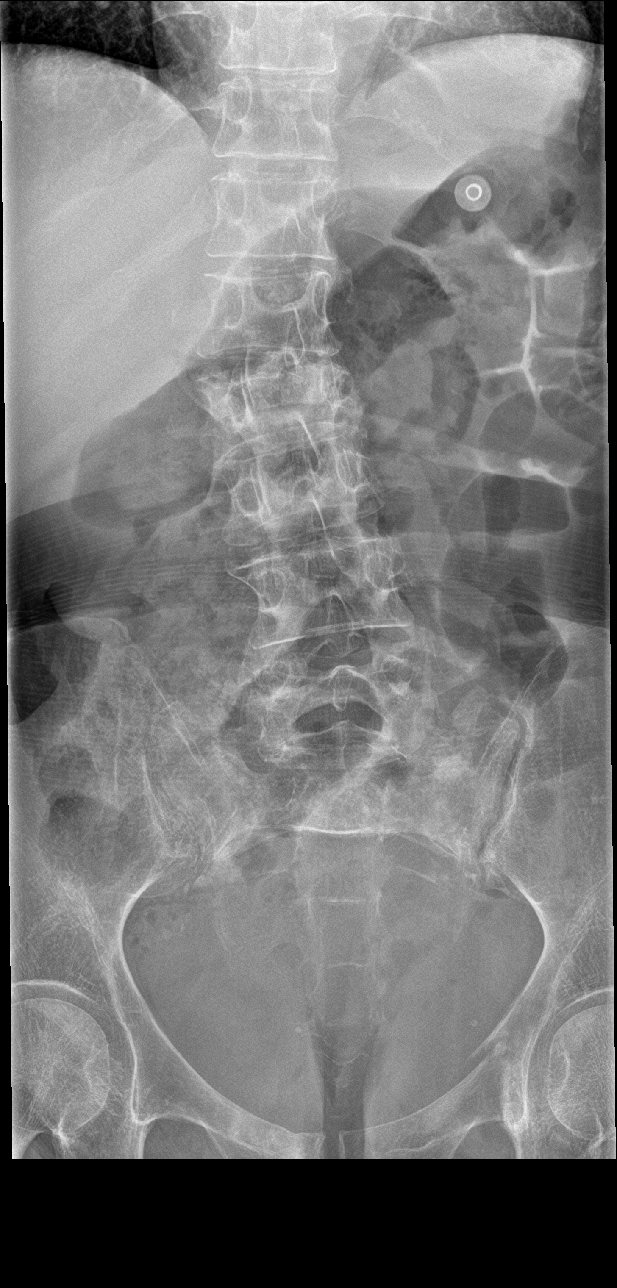

[l-spine obl (1 of 2)]
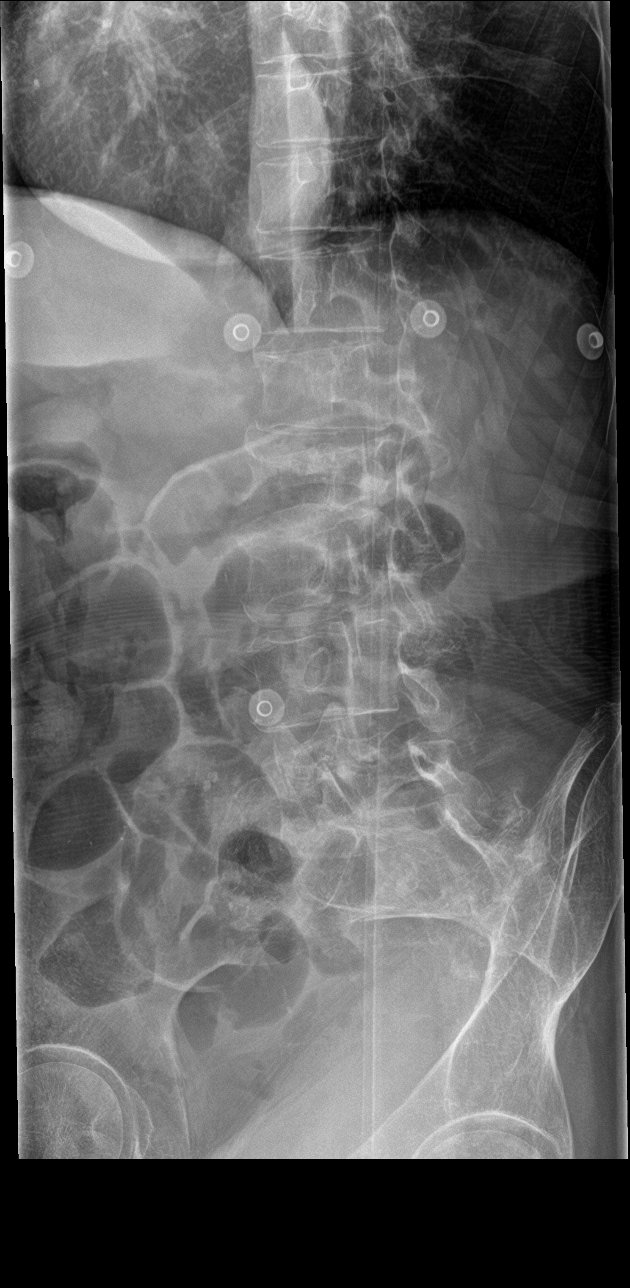

[l-spine obl (2 of 2)]
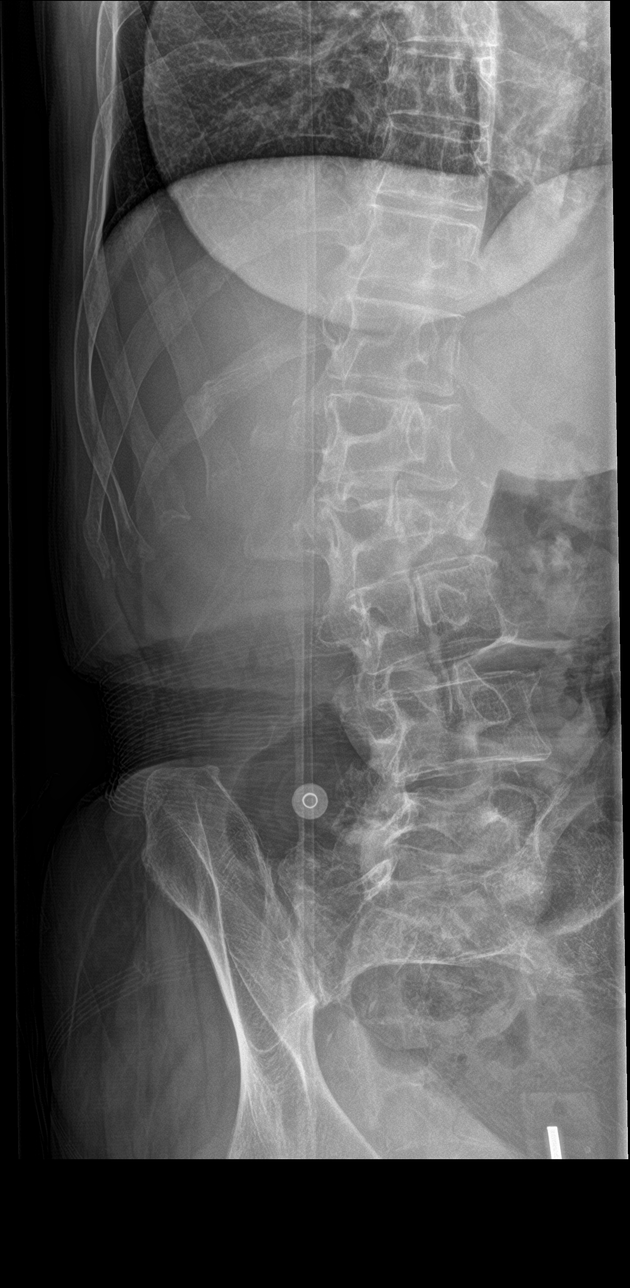

[l-spine lat]
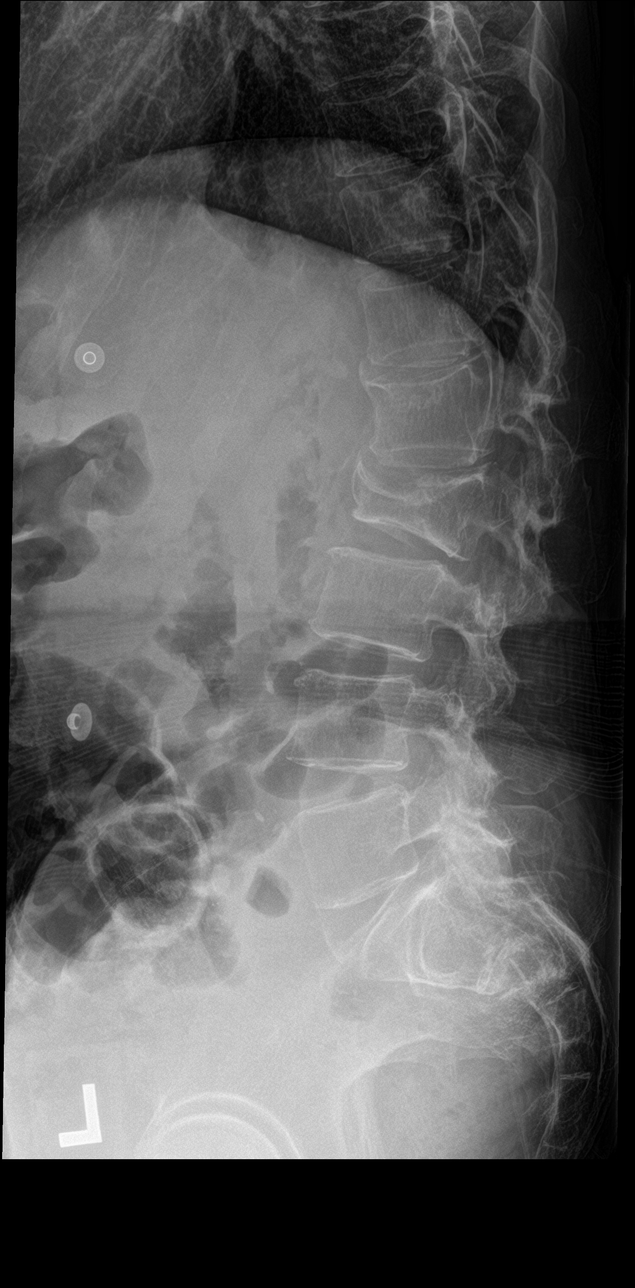

[l-spine spot]
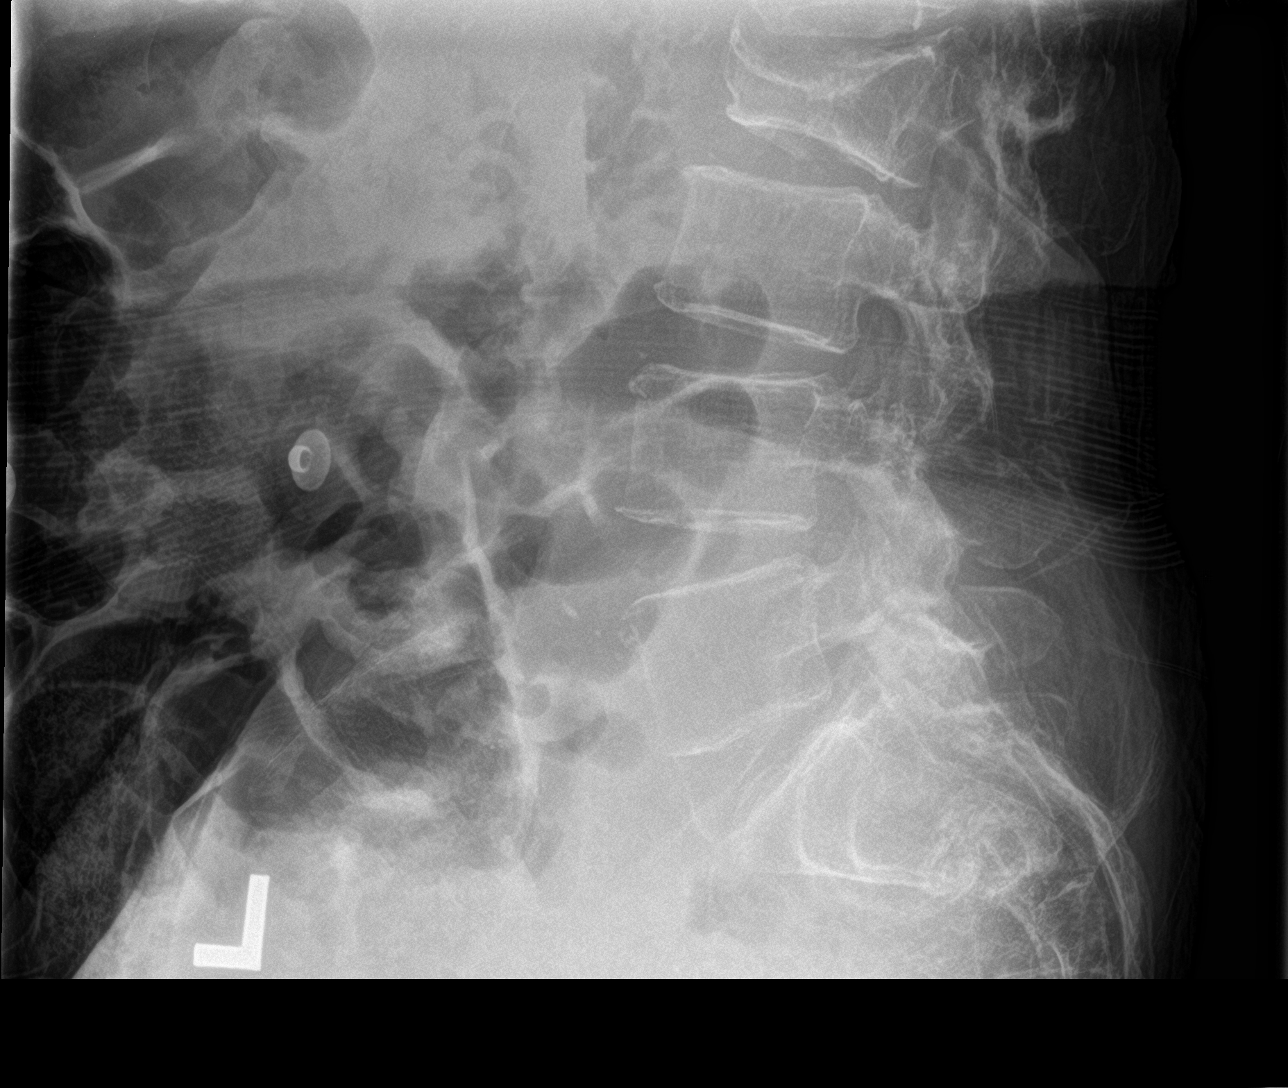

[5 of 5 positions shown; findings below may reference images not displayed]

FINDINGS: Osseous demineralization.

Five non-rib-bearing lumbar vertebra.

Mild dextroconvex thoracolumbar scoliosis.

Superior endplate compression fracture of L2 vertebral body with
nearly 50% anterior height loss, unchanged.

Mild superior endplate compression deformity of L1 vertebral body,
with minimal anterior height loss, unchanged.

No additional fracture, subluxation, or bone destruction.

S2 fracture of sacrum, mildly displaced, unchanged.

SI joints preserved.
IMPRESSION: Osseous demineralization with chronic superior endplate compression
fractures of L2 and to lesser degree L1.

Subacute S2 fracture of sacrum.

## 2022-10-25 DIAGNOSIS — K5903 Drug induced constipation: Secondary | ICD-10-CM | POA: Diagnosis not present

## 2022-10-25 DIAGNOSIS — M545 Low back pain, unspecified: Secondary | ICD-10-CM | POA: Diagnosis not present

## 2022-10-25 DIAGNOSIS — S32049A Unspecified fracture of fourth lumbar vertebra, initial encounter for closed fracture: Secondary | ICD-10-CM | POA: Diagnosis not present

## 2022-10-25 DIAGNOSIS — M5136 Other intervertebral disc degeneration, lumbar region: Secondary | ICD-10-CM | POA: Diagnosis not present

## 2022-10-25 DIAGNOSIS — M5416 Radiculopathy, lumbar region: Secondary | ICD-10-CM | POA: Diagnosis not present

## 2022-10-25 DIAGNOSIS — Z961 Presence of intraocular lens: Secondary | ICD-10-CM | POA: Diagnosis not present

## 2022-10-25 DIAGNOSIS — Z8781 Personal history of (healed) traumatic fracture: Secondary | ICD-10-CM | POA: Diagnosis not present

## 2022-10-25 DIAGNOSIS — M47816 Spondylosis without myelopathy or radiculopathy, lumbar region: Secondary | ICD-10-CM | POA: Diagnosis not present

## 2022-10-25 DIAGNOSIS — H26493 Other secondary cataract, bilateral: Secondary | ICD-10-CM | POA: Diagnosis not present

## 2022-10-25 DIAGNOSIS — R208 Other disturbances of skin sensation: Secondary | ICD-10-CM | POA: Diagnosis not present

## 2022-10-25 DIAGNOSIS — Z79891 Long term (current) use of opiate analgesic: Secondary | ICD-10-CM | POA: Diagnosis not present

## 2022-10-25 DIAGNOSIS — M48061 Spinal stenosis, lumbar region without neurogenic claudication: Secondary | ICD-10-CM | POA: Diagnosis not present

## 2022-10-25 DIAGNOSIS — H26491 Other secondary cataract, right eye: Secondary | ICD-10-CM | POA: Diagnosis not present

## 2022-10-25 DIAGNOSIS — H18413 Arcus senilis, bilateral: Secondary | ICD-10-CM | POA: Diagnosis not present

## 2022-10-30 ENCOUNTER — Other Ambulatory Visit: Payer: Self-pay

## 2022-10-30 MED ORDER — TRAZODONE HCL 50 MG PO TABS: 50.0000 mg | ORAL_TABLET | Freq: Every day | ORAL | 1 refills | Status: DC

## 2022-11-05 ENCOUNTER — Other Ambulatory Visit: Payer: Self-pay

## 2022-11-05 MED ORDER — TOPIRAMATE 100 MG PO TABS: 150.0000 mg | ORAL_TABLET | Freq: Two times a day (BID) | ORAL | 2 refills | Status: DC

## 2022-11-06 ENCOUNTER — Other Ambulatory Visit: Payer: Self-pay | Admitting: Internal Medicine

## 2022-11-07 ENCOUNTER — Other Ambulatory Visit: Payer: Self-pay

## 2022-11-07 MED ORDER — HYDROXYZINE HCL 50 MG PO TABS: 50.0000 mg | ORAL_TABLET | Freq: Every day | ORAL | 3 refills | Status: DC

## 2022-11-08 ENCOUNTER — Other Ambulatory Visit: Payer: Self-pay

## 2022-11-08 MED ORDER — HYDROXYZINE HCL 50 MG PO TABS: 50.0000 mg | ORAL_TABLET | Freq: Every day | ORAL | 3 refills | Status: DC

## 2022-11-12 ENCOUNTER — Other Ambulatory Visit: Payer: Self-pay

## 2022-11-12 MED ORDER — TOPIRAMATE 100 MG PO TABS: 150.0000 mg | ORAL_TABLET | Freq: Two times a day (BID) | ORAL | 2 refills | Status: DC

## 2022-11-12 MED ORDER — TRAZODONE HCL 50 MG PO TABS: 50.0000 mg | ORAL_TABLET | Freq: Every day | ORAL | 1 refills | Status: DC

## 2022-11-20 DIAGNOSIS — H26492 Other secondary cataract, left eye: Secondary | ICD-10-CM | POA: Diagnosis not present

## 2022-11-22 DIAGNOSIS — R208 Other disturbances of skin sensation: Secondary | ICD-10-CM | POA: Diagnosis not present

## 2022-11-22 DIAGNOSIS — Z8781 Personal history of (healed) traumatic fracture: Secondary | ICD-10-CM | POA: Diagnosis not present

## 2022-11-22 DIAGNOSIS — Z79891 Long term (current) use of opiate analgesic: Secondary | ICD-10-CM | POA: Diagnosis not present

## 2022-11-22 DIAGNOSIS — M5416 Radiculopathy, lumbar region: Secondary | ICD-10-CM | POA: Diagnosis not present

## 2022-11-22 DIAGNOSIS — M48061 Spinal stenosis, lumbar region without neurogenic claudication: Secondary | ICD-10-CM | POA: Diagnosis not present

## 2022-11-22 DIAGNOSIS — Z1389 Encounter for screening for other disorder: Secondary | ICD-10-CM | POA: Diagnosis not present

## 2022-11-22 DIAGNOSIS — M5136 Other intervertebral disc degeneration, lumbar region: Secondary | ICD-10-CM | POA: Diagnosis not present

## 2022-11-22 DIAGNOSIS — S32049A Unspecified fracture of fourth lumbar vertebra, initial encounter for closed fracture: Secondary | ICD-10-CM | POA: Diagnosis not present

## 2022-11-22 DIAGNOSIS — M47816 Spondylosis without myelopathy or radiculopathy, lumbar region: Secondary | ICD-10-CM | POA: Diagnosis not present

## 2022-11-22 DIAGNOSIS — K5903 Drug induced constipation: Secondary | ICD-10-CM | POA: Diagnosis not present

## 2022-11-22 DIAGNOSIS — M545 Low back pain, unspecified: Secondary | ICD-10-CM | POA: Diagnosis not present

## 2022-11-30 ENCOUNTER — Other Ambulatory Visit: Payer: Self-pay

## 2022-11-30 ENCOUNTER — Other Ambulatory Visit: Payer: Self-pay | Admitting: Internal Medicine

## 2022-11-30 MED ORDER — OMEPRAZOLE 40 MG PO CPDR: 40.0000 mg | DELAYED_RELEASE_CAPSULE | Freq: Two times a day (BID) | ORAL | 1 refills | Status: DC

## 2022-12-03 ENCOUNTER — Ambulatory Visit: Payer: Self-pay

## 2022-12-03 NOTE — Patient Outreach (Signed)
  Care Coordination   Follow Up Visit Note   12/03/2022 Name: [name] MRN: [id number] DOB: 04-Feb-1966  [name] is a 57 y.o. year old female who sees Crist Fat, MD for primary care. I spoke with  Doyle Askew by phone today.  What matters to the patients health and wellness today?  Reports she is having more headaches.   Reports that headache she has right now she has had for 2 days. Using ibuprofen.  Reports sometimes sleeping the headache off works.  Reports she continues to go to pain management.  Reports no one is really helping with the headaches.   No recent falls.  Reports able to eat and drink well.  Denies wanting to call MD office to inform.     Goals Addressed               This Visit's Progress     COMPLETED: Fall prevention (pt-stated)        Interventions Today    Flowsheet Row Most Recent Value  Chronic Disease   Chronic disease during today's visit Other  [Headaches and chronic pain.]  General Interventions   General Interventions Discussed/Reviewed General Interventions Reviewed  Exercise Interventions   Exercise Discussed/Reviewed Physical Activity  Physical Activity Discussed/Reviewed Physical Activity Reviewed, Types of exercise  Nutrition Interventions   Nutrition Discussed/Reviewed Nutrition Reviewed  Pharmacy Interventions   Pharmacy Dicussed/Reviewed Medications and their functions  Safety Interventions   Safety Discussed/Reviewed Fall Risk              SDOH assessments and interventions completed:  No     Care Coordination Interventions:  Yes, provided   Follow up plan: No further intervention required.   Encounter Outcome:  Pt. Visit Completed   Rowe Pavy, RN, BSN, CEN [facility] Coordinator [phone]

## 2022-12-07 DIAGNOSIS — M1712 Unilateral primary osteoarthritis, left knee: Secondary | ICD-10-CM | POA: Diagnosis not present

## 2022-12-07 DIAGNOSIS — G8929 Other chronic pain: Secondary | ICD-10-CM | POA: Diagnosis not present

## 2022-12-07 DIAGNOSIS — M25531 Pain in right wrist: Secondary | ICD-10-CM | POA: Diagnosis not present

## 2022-12-08 ENCOUNTER — Other Ambulatory Visit: Payer: Self-pay | Admitting: Internal Medicine

## 2022-12-14 ENCOUNTER — Encounter: Payer: Medicare Other | Admitting: Internal Medicine

## 2022-12-20 DIAGNOSIS — Z8781 Personal history of (healed) traumatic fracture: Secondary | ICD-10-CM | POA: Diagnosis not present

## 2022-12-20 DIAGNOSIS — M545 Low back pain, unspecified: Secondary | ICD-10-CM | POA: Diagnosis not present

## 2022-12-20 DIAGNOSIS — M48061 Spinal stenosis, lumbar region without neurogenic claudication: Secondary | ICD-10-CM | POA: Diagnosis not present

## 2022-12-20 DIAGNOSIS — Z79891 Long term (current) use of opiate analgesic: Secondary | ICD-10-CM | POA: Diagnosis not present

## 2022-12-20 DIAGNOSIS — M47816 Spondylosis without myelopathy or radiculopathy, lumbar region: Secondary | ICD-10-CM | POA: Diagnosis not present

## 2022-12-20 DIAGNOSIS — R208 Other disturbances of skin sensation: Secondary | ICD-10-CM | POA: Diagnosis not present

## 2022-12-20 DIAGNOSIS — M5136 Other intervertebral disc degeneration, lumbar region: Secondary | ICD-10-CM | POA: Diagnosis not present

## 2022-12-20 DIAGNOSIS — K5903 Drug induced constipation: Secondary | ICD-10-CM | POA: Diagnosis not present

## 2022-12-20 DIAGNOSIS — M5416 Radiculopathy, lumbar region: Secondary | ICD-10-CM | POA: Diagnosis not present

## 2022-12-20 DIAGNOSIS — S32049A Unspecified fracture of fourth lumbar vertebra, initial encounter for closed fracture: Secondary | ICD-10-CM | POA: Diagnosis not present

## 2022-12-20 DIAGNOSIS — G894 Chronic pain syndrome: Secondary | ICD-10-CM | POA: Diagnosis not present

## 2023-01-01 ENCOUNTER — Encounter: Payer: Self-pay | Admitting: Internal Medicine

## 2023-01-01 ENCOUNTER — Ambulatory Visit: Payer: Medicare Other | Admitting: Internal Medicine

## 2023-01-01 ENCOUNTER — Other Ambulatory Visit: Payer: Self-pay

## 2023-01-01 VITALS — BP 142/86 | HR 90 | Temp 98.7°F | Resp 17 | Ht 60.0 in | Wt 126.2 lb

## 2023-01-01 DIAGNOSIS — K5909 Other constipation: Secondary | ICD-10-CM | POA: Diagnosis not present

## 2023-01-01 DIAGNOSIS — I10 Essential (primary) hypertension: Secondary | ICD-10-CM | POA: Diagnosis not present

## 2023-01-01 DIAGNOSIS — G43709 Chronic migraine without aura, not intractable, without status migrainosus: Secondary | ICD-10-CM

## 2023-01-01 DIAGNOSIS — M48061 Spinal stenosis, lumbar region without neurogenic claudication: Secondary | ICD-10-CM | POA: Diagnosis not present

## 2023-01-01 DIAGNOSIS — Z6824 Body mass index (BMI) 24.0-24.9, adult: Secondary | ICD-10-CM | POA: Diagnosis not present

## 2023-01-01 DIAGNOSIS — Z13220 Encounter for screening for lipoid disorders: Secondary | ICD-10-CM | POA: Diagnosis not present

## 2023-01-01 DIAGNOSIS — F32A Depression, unspecified: Secondary | ICD-10-CM

## 2023-01-01 DIAGNOSIS — G4700 Insomnia, unspecified: Secondary | ICD-10-CM

## 2023-01-01 DIAGNOSIS — Z0000 Encounter for general adult medical examination without abnormal findings: Secondary | ICD-10-CM | POA: Diagnosis not present

## 2023-01-01 DIAGNOSIS — E782 Mixed hyperlipidemia: Secondary | ICD-10-CM | POA: Diagnosis not present

## 2023-01-01 DIAGNOSIS — R799 Abnormal finding of blood chemistry, unspecified: Secondary | ICD-10-CM | POA: Diagnosis not present

## 2023-01-01 DIAGNOSIS — Z1329 Encounter for screening for other suspected endocrine disorder: Secondary | ICD-10-CM | POA: Diagnosis not present

## 2023-01-01 DIAGNOSIS — I700 Atherosclerosis of aorta: Secondary | ICD-10-CM | POA: Insufficient documentation

## 2023-01-01 DIAGNOSIS — S32000S Wedge compression fracture of unspecified lumbar vertebra, sequela: Secondary | ICD-10-CM

## 2023-01-01 DIAGNOSIS — K219 Gastro-esophageal reflux disease without esophagitis: Secondary | ICD-10-CM

## 2023-01-01 DIAGNOSIS — G894 Chronic pain syndrome: Secondary | ICD-10-CM | POA: Diagnosis not present

## 2023-01-01 DIAGNOSIS — M8000XG Age-related osteoporosis with current pathological fracture, unspecified site, subsequent encounter for fracture with delayed healing: Secondary | ICD-10-CM

## 2023-01-01 DIAGNOSIS — J302 Other seasonal allergic rhinitis: Secondary | ICD-10-CM

## 2023-01-01 DIAGNOSIS — R222 Localized swelling, mass and lump, trunk: Secondary | ICD-10-CM | POA: Diagnosis not present

## 2023-01-01 MED ORDER — FLUTICASONE PROPIONATE 50 MCG/ACT NA SUSP: 1.0000 | Freq: Two times a day (BID) | NASAL | 0 refills | Status: DC

## 2023-01-01 MED ORDER — LISINOPRIL 10 MG PO TABS: 10.0000 mg | ORAL_TABLET | Freq: Every day | ORAL | 3 refills | Status: DC

## 2023-01-01 MED ORDER — LORATADINE 10 MG PO TABS: 10.0000 mg | ORAL_TABLET | Freq: Every day | ORAL | 1 refills | Status: DC

## 2023-01-01 MED ORDER — ONDANSETRON 4 MG PO TBDP: 4.0000 mg | ORAL_TABLET | Freq: Four times a day (QID) | ORAL | 0 refills | Status: DC | PRN

## 2023-01-01 MED ORDER — TOPIRAMATE 100 MG PO TABS: 150.0000 mg | ORAL_TABLET | Freq: Two times a day (BID) | ORAL | 2 refills | Status: DC

## 2023-01-01 MED ORDER — OMEPRAZOLE 40 MG PO CPDR: 40.0000 mg | DELAYED_RELEASE_CAPSULE | Freq: Two times a day (BID) | ORAL | 1 refills | Status: DC

## 2023-01-01 MED ORDER — NURTEC 75 MG PO TBDP: 1.0000 | ORAL_TABLET | ORAL | 5 refills | Status: DC

## 2023-01-01 MED ORDER — UBRELVY 100 MG PO TABS: 100.0000 mg | ORAL_TABLET | Freq: Two times a day (BID) | ORAL | 2 refills | Status: DC
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
## 2023-01-01 NOTE — Progress Notes (Signed)
RX refill

## 2023-01-02 ENCOUNTER — Other Ambulatory Visit: Payer: Self-pay | Admitting: Internal Medicine

## 2023-01-02 DIAGNOSIS — G4700 Insomnia, unspecified: Secondary | ICD-10-CM | POA: Insufficient documentation

## 2023-01-02 DIAGNOSIS — Z6824 Body mass index (BMI) 24.0-24.9, adult: Secondary | ICD-10-CM | POA: Insufficient documentation

## 2023-01-02 DIAGNOSIS — M48061 Spinal stenosis, lumbar region without neurogenic claudication: Secondary | ICD-10-CM | POA: Insufficient documentation

## 2023-01-02 DIAGNOSIS — S32000A Wedge compression fracture of unspecified lumbar vertebra, initial encounter for closed fracture: Secondary | ICD-10-CM | POA: Insufficient documentation

## 2023-01-02 DIAGNOSIS — F32A Depression, unspecified: Secondary | ICD-10-CM | POA: Insufficient documentation

## 2023-01-02 DIAGNOSIS — E782 Mixed hyperlipidemia: Secondary | ICD-10-CM | POA: Insufficient documentation

## 2023-01-02 DIAGNOSIS — K219 Gastro-esophageal reflux disease without esophagitis: Secondary | ICD-10-CM | POA: Insufficient documentation

## 2023-01-02 DIAGNOSIS — K5909 Other constipation: Secondary | ICD-10-CM | POA: Insufficient documentation

## 2023-01-02 DIAGNOSIS — I10 Essential (primary) hypertension: Secondary | ICD-10-CM | POA: Insufficient documentation

## 2023-01-02 LAB — CBC WITH DIFFERENTIAL/PLATELET
Basophils Absolute: 0.1 10*3/uL (ref 0.0–0.2)
Basos: 1 %
EOS (ABSOLUTE): 0.2 10*3/uL (ref 0.0–0.4)
Eos: 3 %
Hematocrit: 33.1 % — ABNORMAL LOW (ref 34.0–46.6)
Hemoglobin: 10.4 g/dL — ABNORMAL LOW (ref 11.1–15.9)
Immature Grans (Abs): 0 10*3/uL (ref 0.0–0.1)
Immature Granulocytes: 0 %
Lymphocytes Absolute: 1.8 10*3/uL (ref 0.7–3.1)
Lymphs: 37 %
MCH: 27.4 pg (ref 26.6–33.0)
MCHC: 31.4 g/dL — ABNORMAL LOW (ref 31.5–35.7)
MCV: 87 fL (ref 79–97)
Monocytes Absolute: 0.5 10*3/uL (ref 0.1–0.9)
Monocytes: 10 %
Neutrophils Absolute: 2.4 10*3/uL (ref 1.4–7.0)
Neutrophils: 49 %
Platelets: 318 10*3/uL (ref 150–450)
RBC: 3.79 x10E6/uL (ref 3.77–5.28)
RDW: 13.9 % (ref 11.7–15.4)
WBC: 4.9 10*3/uL (ref 3.4–10.8)

## 2023-01-02 LAB — CMP14 + ANION GAP
ALT: 12 IU/L (ref 0–32)
AST: 27 IU/L (ref 0–40)
Albumin: 4.7 g/dL (ref 3.8–4.9)
Alkaline Phosphatase: 150 IU/L — ABNORMAL HIGH (ref 44–121)
Anion Gap: 16 mmol/L (ref 10.0–18.0)
BUN/Creatinine Ratio: 24 — ABNORMAL HIGH (ref 9–23)
BUN: 25 mg/dL — ABNORMAL HIGH (ref 6–24)
Bilirubin Total: <0.2 mg/dL (ref 0.0–1.2)
CO2: 18 mmol/L — ABNORMAL LOW (ref 20–29)
Calcium: 10.5 mg/dL — ABNORMAL HIGH (ref 8.7–10.2)
Chloride: 106 mmol/L (ref 96–106)
Creatinine, Ser: 1.04 mg/dL — ABNORMAL HIGH (ref 0.57–1.00)
Globulin, Total: 2.5 g/dL (ref 1.5–4.5)
Glucose: 96 mg/dL (ref 70–99)
Potassium: 5.1 mmol/L (ref 3.5–5.2)
Sodium: 140 mmol/L (ref 134–144)
Total Protein: 7.2 g/dL (ref 6.0–8.5)
eGFR: 62 mL/min/{1.73_m2} (ref >59)

## 2023-01-02 LAB — LIPID PANEL
Chol/HDL Ratio: 2.9 ratio (ref 0.0–4.4)
Cholesterol, Total: 256 mg/dL — ABNORMAL HIGH (ref 100–199)
HDL: 88 mg/dL (ref >39)
LDL Chol Calc (NIH): 155 mg/dL — ABNORMAL HIGH (ref 0–99)
Triglycerides: 78 mg/dL (ref 0–149)
VLDL Cholesterol Cal: 13 mg/dL (ref 5–40)

## 2023-01-02 LAB — HEMOGLOBIN A1C
Est. average glucose Bld gHb Est-mCnc: 114 mg/dL
Hgb A1c MFr Bld: 5.6 % (ref 4.8–5.6)

## 2023-01-02 LAB — TSH: TSH: 4.13 u[IU]/mL (ref 0.450–4.500)

## 2023-01-02 NOTE — Assessment & Plan Note (Signed)
Her BP is a bit elevated.  We will see what her BP is running on her next visit and if it remains borderline, we may go up on her dose of lisinopril.

## 2023-01-02 NOTE — Assessment & Plan Note (Signed)
Continue on her PPI at this time.

## 2023-01-02 NOTE — Assessment & Plan Note (Signed)
She is currently on symproic for her opoid induced constipation.

## 2023-01-02 NOTE — Assessment & Plan Note (Addendum)
There was a spiculated right lower lobe nodule along the pleural surface from her CT scan of her abd/pelvis done in 02/2022.  They recommended followup imaging where we will get a CT of her chest.

## 2023-01-02 NOTE — Assessment & Plan Note (Signed)
I want her to eat healthy and exercise as she can.

## 2023-01-02 NOTE — Assessment & Plan Note (Signed)
She was noticed on CT scan to have aortic atherosclerosis.  We will obtain her FLP today.  She is not on a statin.

## 2023-01-02 NOTE — Assessment & Plan Note (Signed)
The patient is followed by [facility] who has her on pain meds that they manage.

## 2023-01-02 NOTE — Assessment & Plan Note (Signed)
She seen allergy and immunology whom she needs to followup with.  We will continue on her meds they wrote from their clinic.

## 2023-01-02 NOTE — Assessment & Plan Note (Signed)
Her migraines have worsened but she does not want to switch her meds at this time.  She can continue the topamax and nurtec.

## 2023-01-02 NOTE — Assessment & Plan Note (Signed)
Plan as below.

## 2023-01-02 NOTE — Assessment & Plan Note (Signed)
She has mild recurrent major depression that is stable.  She is not on an SSRI.  We will continue to follow.

## 2023-01-02 NOTE — Assessment & Plan Note (Signed)
Health maintenance discussed.  She needs to reschedule her colonoscopy thru Dr. Chales Abrahams and we will call and try to arrange this.  WE will obtain some yearly labs.

## 2023-01-02 NOTE — Assessment & Plan Note (Signed)
Her recent bone density was reviewed from last year.  She is currently on forteo treatment thru [facility].

## 2023-01-02 NOTE — Assessment & Plan Note (Signed)
She again has aortic atherosclerosis.  She needs to be on a statin and we will check her FLP today.

## 2023-01-04 ENCOUNTER — Other Ambulatory Visit: Payer: Self-pay

## 2023-01-04 DIAGNOSIS — E7800 Pure hypercholesterolemia, unspecified: Secondary | ICD-10-CM

## 2023-01-04 MED ORDER — PRAVASTATIN SODIUM 40 MG PO TABS: 40.0000 mg | ORAL_TABLET | Freq: Every evening | ORAL | 11 refills | Status: DC

## 2023-01-04 NOTE — Progress Notes (Signed)
She is anemic. She needs to go for her colonoscopy. She was a bit dehydrated on her yearly exam and her calcium level was a bit elevated. We will check this again on her next visit. She has aortic atherosclerosis and her cholesterol levels are up. Start on pravastatin 40mg  at bedtime. Come back on her next visit fasting.   Spoke with patient. Aware of lab results, and new med

## 2023-01-04 NOTE — Progress Notes (Signed)
New RX

## 2023-01-07 DIAGNOSIS — S92345A Nondisplaced fracture of fourth metatarsal bone, left foot, initial encounter for closed fracture: Secondary | ICD-10-CM | POA: Diagnosis not present

## 2023-01-07 DIAGNOSIS — S92335A Nondisplaced fracture of third metatarsal bone, left foot, initial encounter for closed fracture: Secondary | ICD-10-CM | POA: Diagnosis not present

## 2023-01-07 DIAGNOSIS — S92355A Nondisplaced fracture of fifth metatarsal bone, left foot, initial encounter for closed fracture: Secondary | ICD-10-CM | POA: Diagnosis not present

## 2023-01-10 DIAGNOSIS — M5416 Radiculopathy, lumbar region: Secondary | ICD-10-CM | POA: Diagnosis not present

## 2023-01-10 DIAGNOSIS — S32049A Unspecified fracture of fourth lumbar vertebra, initial encounter for closed fracture: Secondary | ICD-10-CM | POA: Diagnosis not present

## 2023-01-10 DIAGNOSIS — M47816 Spondylosis without myelopathy or radiculopathy, lumbar region: Secondary | ICD-10-CM | POA: Diagnosis not present

## 2023-01-10 DIAGNOSIS — Z8781 Personal history of (healed) traumatic fracture: Secondary | ICD-10-CM | POA: Diagnosis not present

## 2023-01-10 DIAGNOSIS — M48061 Spinal stenosis, lumbar region without neurogenic claudication: Secondary | ICD-10-CM | POA: Diagnosis not present

## 2023-01-10 DIAGNOSIS — K5903 Drug induced constipation: Secondary | ICD-10-CM | POA: Diagnosis not present

## 2023-01-10 DIAGNOSIS — Z79891 Long term (current) use of opiate analgesic: Secondary | ICD-10-CM | POA: Diagnosis not present

## 2023-01-10 DIAGNOSIS — R208 Other disturbances of skin sensation: Secondary | ICD-10-CM | POA: Diagnosis not present

## 2023-01-10 DIAGNOSIS — M5136 Other intervertebral disc degeneration, lumbar region: Secondary | ICD-10-CM | POA: Diagnosis not present

## 2023-01-10 DIAGNOSIS — M545 Low back pain, unspecified: Secondary | ICD-10-CM | POA: Diagnosis not present

## 2023-01-18 DIAGNOSIS — Z7189 Other specified counseling: Secondary | ICD-10-CM | POA: Diagnosis not present

## 2023-01-18 DIAGNOSIS — Z5181 Encounter for therapeutic drug level monitoring: Secondary | ICD-10-CM | POA: Diagnosis not present

## 2023-01-18 DIAGNOSIS — M818 Other osteoporosis without current pathological fracture: Secondary | ICD-10-CM | POA: Diagnosis not present

## 2023-01-18 DIAGNOSIS — Z79899 Other long term (current) drug therapy: Secondary | ICD-10-CM | POA: Diagnosis not present

## 2023-01-24 ENCOUNTER — Other Ambulatory Visit: Payer: Self-pay | Admitting: Internal Medicine

## 2023-02-04 DIAGNOSIS — S92335A Nondisplaced fracture of third metatarsal bone, left foot, initial encounter for closed fracture: Secondary | ICD-10-CM | POA: Diagnosis not present

## 2023-02-04 DIAGNOSIS — F17200 Nicotine dependence, unspecified, uncomplicated: Secondary | ICD-10-CM | POA: Insufficient documentation

## 2023-02-05 ENCOUNTER — Ambulatory Visit (AMBULATORY_SURGERY_CENTER): Payer: Medicare Other | Admitting: *Deleted

## 2023-02-05 VITALS — Ht 60.0 in | Wt 136.0 lb

## 2023-02-05 DIAGNOSIS — R109 Unspecified abdominal pain: Secondary | ICD-10-CM

## 2023-02-05 DIAGNOSIS — K2100 Gastro-esophageal reflux disease with esophagitis, without bleeding: Secondary | ICD-10-CM

## 2023-02-05 DIAGNOSIS — D509 Iron deficiency anemia, unspecified: Secondary | ICD-10-CM

## 2023-02-05 DIAGNOSIS — Z1211 Encounter for screening for malignant neoplasm of colon: Secondary | ICD-10-CM

## 2023-02-05 MED ORDER — NA SULFATE-K SULFATE-MG SULF 17.5-3.13-1.6 GM/177ML PO SOLN
1.0000 | Freq: Once | ORAL | 0 refills | Status: AC
Start: 2023-02-05 — End: 2023-02-05

## 2023-02-05 NOTE — Progress Notes (Signed)
Pt's name and DOB verified at the beginning of the pre-visit wit 2 identifiers  Pt uses Rolator to ambulate but no unsteady gait with it's use. Able to transfer to a chair with no assistance,  Gave both [facility] main # and MD on call # prior to instructions.   No egg or soy allergy known to patient   No issues known to pt with past sedation with any surgeries or procedures  Pt denies having issues being intubated  Patient denies ever being intubated  Pt has no issues moving head neck or swallowing  No FH of Malignant Hyperthermia  Pt is not on diet pills or shots  Pt is not on home 02   Pt is not on blood thinners   Pt has frequent issues with constipation RN instructed pt to use Miralax per bottles instructions a week before prep days. Pt states they will  Pt is not on dialysis  Pt denise any abnormal heart rhythms   Pt denies any upcoming cardiac testing  Pt encouraged to use to use Singlecare or Goodrx to reduce cost   Patient's chart reviewed by Cathlyn Parsons CNRA prior to pre-visit and patient appropriate for the [facility].  Pre-visit completed and red dot placed by patient's name on their procedure day (on provider's schedule).  .  Viin person   Pt states weight is 126 lb  Instructed pt why it is important to and  to call if they have any changes in health or new medications. Directed them to the # given and on instructions.     Instructions reviewed with pt and pt states understanding. Instructed to review again prior to procedure. Pt states they will.  Given to pt and by my chart

## 2023-02-06 ENCOUNTER — Other Ambulatory Visit: Payer: Self-pay | Admitting: Internal Medicine

## 2023-02-07 DIAGNOSIS — K5903 Drug induced constipation: Secondary | ICD-10-CM | POA: Diagnosis not present

## 2023-02-07 DIAGNOSIS — M40204 Unspecified kyphosis, thoracic region: Secondary | ICD-10-CM | POA: Diagnosis not present

## 2023-02-07 DIAGNOSIS — R208 Other disturbances of skin sensation: Secondary | ICD-10-CM | POA: Diagnosis not present

## 2023-02-07 DIAGNOSIS — Z8781 Personal history of (healed) traumatic fracture: Secondary | ICD-10-CM | POA: Diagnosis not present

## 2023-02-07 DIAGNOSIS — M48061 Spinal stenosis, lumbar region without neurogenic claudication: Secondary | ICD-10-CM | POA: Diagnosis not present

## 2023-02-07 DIAGNOSIS — R296 Repeated falls: Secondary | ICD-10-CM | POA: Diagnosis not present

## 2023-02-07 DIAGNOSIS — M5416 Radiculopathy, lumbar region: Secondary | ICD-10-CM | POA: Diagnosis not present

## 2023-02-07 DIAGNOSIS — M51369 Other intervertebral disc degeneration, lumbar region without mention of lumbar back pain or lower extremity pain: Secondary | ICD-10-CM | POA: Diagnosis not present

## 2023-02-07 DIAGNOSIS — M47816 Spondylosis without myelopathy or radiculopathy, lumbar region: Secondary | ICD-10-CM | POA: Diagnosis not present

## 2023-02-07 DIAGNOSIS — M546 Pain in thoracic spine: Secondary | ICD-10-CM | POA: Diagnosis not present

## 2023-02-13 DIAGNOSIS — M25531 Pain in right wrist: Secondary | ICD-10-CM | POA: Diagnosis not present

## 2023-02-13 DIAGNOSIS — G8929 Other chronic pain: Secondary | ICD-10-CM | POA: Diagnosis not present

## 2023-02-13 DIAGNOSIS — S56301S Unspecified injury of extensor or abductor muscles, fascia and tendons of right thumb at forearm level, sequela: Secondary | ICD-10-CM | POA: Diagnosis not present

## 2023-02-25 DIAGNOSIS — M25531 Pain in right wrist: Secondary | ICD-10-CM | POA: Diagnosis not present

## 2023-02-25 DIAGNOSIS — M19041 Primary osteoarthritis, right hand: Secondary | ICD-10-CM | POA: Diagnosis not present

## 2023-02-25 DIAGNOSIS — M1811 Unilateral primary osteoarthritis of first carpometacarpal joint, right hand: Secondary | ICD-10-CM | POA: Diagnosis not present

## 2023-02-25 DIAGNOSIS — M25431 Effusion, right wrist: Secondary | ICD-10-CM | POA: Diagnosis not present

## 2023-02-25 DIAGNOSIS — M19031 Primary osteoarthritis, right wrist: Secondary | ICD-10-CM | POA: Diagnosis not present

## 2023-02-26 ENCOUNTER — Encounter: Payer: Self-pay | Admitting: Gastroenterology

## 2023-02-27 ENCOUNTER — Other Ambulatory Visit: Payer: Self-pay | Admitting: Internal Medicine

## 2023-02-28 DIAGNOSIS — M40204 Unspecified kyphosis, thoracic region: Secondary | ICD-10-CM | POA: Diagnosis not present

## 2023-02-28 DIAGNOSIS — K5903 Drug induced constipation: Secondary | ICD-10-CM | POA: Diagnosis not present

## 2023-02-28 DIAGNOSIS — Z8781 Personal history of (healed) traumatic fracture: Secondary | ICD-10-CM | POA: Diagnosis not present

## 2023-02-28 DIAGNOSIS — M51369 Other intervertebral disc degeneration, lumbar region without mention of lumbar back pain or lower extremity pain: Secondary | ICD-10-CM | POA: Diagnosis not present

## 2023-02-28 DIAGNOSIS — M546 Pain in thoracic spine: Secondary | ICD-10-CM | POA: Diagnosis not present

## 2023-02-28 DIAGNOSIS — R296 Repeated falls: Secondary | ICD-10-CM | POA: Diagnosis not present

## 2023-02-28 DIAGNOSIS — R208 Other disturbances of skin sensation: Secondary | ICD-10-CM | POA: Diagnosis not present

## 2023-02-28 DIAGNOSIS — M5416 Radiculopathy, lumbar region: Secondary | ICD-10-CM | POA: Diagnosis not present

## 2023-02-28 DIAGNOSIS — M48061 Spinal stenosis, lumbar region without neurogenic claudication: Secondary | ICD-10-CM | POA: Diagnosis not present

## 2023-02-28 DIAGNOSIS — M47816 Spondylosis without myelopathy or radiculopathy, lumbar region: Secondary | ICD-10-CM | POA: Diagnosis not present

## 2023-03-04 DIAGNOSIS — S92335A Nondisplaced fracture of third metatarsal bone, left foot, initial encounter for closed fracture: Secondary | ICD-10-CM | POA: Diagnosis not present

## 2023-03-08 ENCOUNTER — Ambulatory Visit: Payer: Medicare Other | Admitting: Gastroenterology

## 2023-03-08 ENCOUNTER — Other Ambulatory Visit: Payer: Self-pay | Admitting: Internal Medicine

## 2023-03-08 ENCOUNTER — Encounter: Payer: Self-pay | Admitting: Gastroenterology

## 2023-03-08 VITALS — BP 139/81 | HR 85 | Temp 99.9°F | Resp 14 | Ht 60.0 in | Wt 136.0 lb

## 2023-03-08 DIAGNOSIS — R109 Unspecified abdominal pain: Secondary | ICD-10-CM

## 2023-03-08 DIAGNOSIS — K319 Disease of stomach and duodenum, unspecified: Secondary | ICD-10-CM | POA: Diagnosis not present

## 2023-03-08 DIAGNOSIS — D509 Iron deficiency anemia, unspecified: Secondary | ICD-10-CM

## 2023-03-08 DIAGNOSIS — K229 Disease of esophagus, unspecified: Secondary | ICD-10-CM | POA: Diagnosis not present

## 2023-03-08 DIAGNOSIS — I10 Essential (primary) hypertension: Secondary | ICD-10-CM | POA: Diagnosis not present

## 2023-03-08 DIAGNOSIS — Z1211 Encounter for screening for malignant neoplasm of colon: Secondary | ICD-10-CM

## 2023-03-08 DIAGNOSIS — K219 Gastro-esophageal reflux disease without esophagitis: Secondary | ICD-10-CM | POA: Diagnosis not present

## 2023-03-08 MED ORDER — QUVIVIQ 50 MG PO TABS: ORAL_TABLET | ORAL | 2 refills | Status: DC

## 2023-03-08 MED ORDER — SODIUM CHLORIDE 0.9 % IV SOLN: 500.0000 mL | Freq: Once | INTRAVENOUS | Status: DC

## 2023-03-08 NOTE — Op Note (Signed)
[facility] Patient Name: [name] Procedure Date: 03/08/2023 1:23 PM MRN: [id number] Endoscopist: Lynann Bologna , MD, [id number] Age: 57 Referring MD:  Date of Birth: Jul 04, 1965 Gender: Female Account #: [id number] Procedure:                Colonoscopy Indications:              Screening for colorectal malignant neoplasm. H/O IDA Medicines:                Monitored Anesthesia Care Procedure:                Pre-Anesthesia Assessment:                           - Prior to the procedure, a History and Physical                            was performed, and patient medications and                            allergies were reviewed. The patient's tolerance of                            previous anesthesia was also reviewed. The risks                            and benefits of the procedure and the sedation                            options and risks were discussed with the patient.                            All questions were answered, and informed consent                            was obtained. Prior Anticoagulants: The patient has                            taken no anticoagulant or antiplatelet agents. ASA                            Grade Assessment: II - A patient with mild systemic                            disease. After reviewing the risks and benefits,                            the patient was deemed in satisfactory condition to                            undergo the procedure.                           After obtaining informed consent, the colonoscope  was passed under direct vision. Throughout the                            procedure, the patient's blood pressure, pulse, and                            oxygen saturations were monitored continuously. The                            [id number]-[id number] Colonoscope [id number] was introduced                            through the anus and advanced to the the cecum,                            identified  by appendiceal orifice and ileocecal                            valve. The colonoscopy was performed without                            difficulty. The patient tolerated the procedure                            well. The quality of the bowel preparation was good                            except in the right colon where there was solid                            stool/vegetable material which could not be fully                            washed despite aggressive suctioning and                            aspiration. Hence, examination of the right colon                            was to some extent limited. TI could not be                            intubated due to solid vegetable material. This was                            despite 2-day prep. The ileocecal valve,                            appendiceal orifice, and rectum were photographed. Scope In: 1:50:47 PM Scope Out: 2:05:41 PM Scope Withdrawal Time: 0 hours 9 minutes 16 seconds  Total Procedure Duration: 0 hours 14 minutes 54 seconds  Findings:                 A few small-mouthed diverticula  were found in the                            sigmoid colon.                           Non-bleeding internal hemorrhoids were found during                            retroflexion. The hemorrhoids were small and Grade                            I (internal hemorrhoids that do not prolapse).                           The exam was otherwise without abnormality on                            direct and retroflexion views. The colon was                            somewhat redundant. Complications:            No immediate complications. Estimated Blood Loss:     Estimated blood loss: none. Impression:               - Mild sigmoid diverticulosis                           - Non-bleeding internal hemorrhoids.                           - The examination was otherwise normal on direct                            and retroflexion views.                            - No specimens collected. Recommendation:           - Patient has a contact number available for                            emergencies. The signs and symptoms of potential                            delayed complications were discussed with the                            patient. Return to normal activities tomorrow.                            Written discharge instructions were provided to the                            patient.                           -  Resume previous diet.                           - Continue present medications.                           - Please take MiraLAX 17 g p.o. twice daily for                            now. Minimize pain medications                           - Repeat colonoscopy in 5 years for screening                            purposes with 2-day prep. Earlier, if with any new                            problems.                           - The findings and recommendations were discussed                            with the patient's family. Lynann Bologna, MD 03/08/2023 2:10:34 PM This report has been signed electronically.

## 2023-03-08 NOTE — Progress Notes (Signed)
Chief Complaint: [name] problems  Referring Provider:  Crist Fat, MD      ASSESSMENT AND PLAN;   #1. Abdo pain (L>R) with distention.   #2.  Opioid-induced constipation with prev IBS-C.  #3.  GERD  Plan: -CBC, CMP, CRP, TSH -CT AP with contrast -EGD/colon with 2 day prep -Continie omeprazole 40 BID for now. -Continue MiraLAX 17 g p.o. QD -Minimize pain meds -Increase water intake. -D/W pt and family.   I discussed EGD/Colonoscopy- the indications, risks, alternatives and potential complications including, but not limited to bleeding, infection, reaction to meds, damage to internal organs, cardiac and/or pulmonary problems, and perforation requiring surgery. The possibility that significant findings could be missed was explained. All ? were answered. Pt consents to proceed.   HPI:    [name] is a 57 y.o. female  With multiple medical problems including chronic back pain d/t DJD req narcotics, HTN, migraines, anxiety/depression, history of SDH.  With longstanding history of chronic constipation Worsened by narcotics. She has tried multiple medications in the past including Linzess, magnesium supplements which did help somewhat.  Most recently she has been taking MiraLAX with some relief.  C/O generalized abdominal distention especially after eating.  She also complains of lower abdominal bilateral pain which is more on the left side than on the right side.  This increases when she walks.  No relationship to eating or defecation.  No fever chills or night sweats.  She does have nausea but no vomiting.  Has longstanding history of gastroesophageal reflux and has been on omeprazole 40 mg p.o. twice daily over last several years.  She denies having any odynophagia or dysphagia.  No sodas, chocolates, chewing gums, artificial sweeteners and candy. No NSAIDs except Mobic  No jaundice dark urine or pale stools.  Never had colon.  He has been offered in the past  which she declined.  She underwent CT scan Abdo (no pelvis) without contrast at [facility] on November 03, 2021 which did not show any acute abnormalities.  It was recommended to repeat CT Abdo/pelvis with contrast  SH-her Sister [name] is RN Past Medical History:  Diagnosis Date   Allergic rhinitis    Anemia    Arthritis    Cataract    Chronic pain syndrome    Depression    GERD (gastroesophageal reflux disease)    Headache    migraines   Hypertension    Migraines    Osteoporosis    Subdural hematoma (HCC)    Traumatic brain injury [facility]     Past Surgical History:  Procedure Laterality Date   BRAIN SURGERY     2012 for brain bleed   BREAST ENHANCEMENT SURGERY     CATARACT EXTRACTION Bilateral    EUS N/A 06/06/2017   Procedure: UPPER ENDOSCOPIC ULTRASOUND (EUS) RADIAL;  Surgeon: Rachael Fee, MD;  Location: [facility];  Service: Endoscopy;  Laterality: N/A;    Family History  Problem Relation Age of Onset   Cancer Mother        Spindle Cell   Prostate cancer Father    Colon cancer Neg Hx    Colon polyps Neg Hx    Esophageal cancer Neg Hx    Rectal cancer Neg Hx    Stomach cancer Neg Hx     Social History   Tobacco Use   Smoking status: Former    Current packs/day: 0.00    Average packs/day: 0.8 packs/day for 13.0 years (9.8 ttl pk-yrs)  Types: Cigarettes    Start date: 01/2008    Quit date: 01/2021    Years since quitting: 2.1   Smokeless tobacco: Current   Tobacco comments:    Vapes daily.    Vaping Use   Vaping status: Every Day  Substance Use Topics   Alcohol use: No    Comment: none in 4-5 years   Drug use: No    Current Outpatient Medications  Medication Sig Dispense Refill   Azelastine HCl 137 MCG/SPRAY SOLN INSTILL 2 SPRAYS IN EACH NOSTRIL TWICE DAILY FOR 20 DAYS 0.137 mL 2   Calcium Carb-Cholecalciferol (CALCIUM+D3) 600-800 MG-UNIT TABS Take 1 tablet by mouth 2 (two) times daily.     fentaNYL (DURAGESIC) 25 MCG/HR Place 1 patch  onto the skin every 3 (three) days.     fluticasone (FLONASE) 50 MCG/ACT nasal spray Place 1 spray into both nostrils in the morning and at bedtime. 48 mL 0   gabapentin (NEURONTIN) 800 MG tablet Take 800 mg by mouth 3 (three) times daily.     hydrOXYzine (ATARAX) 50 MG tablet TAKE 1 TABLET BY MOUTH EVERY DAY 90 tablet 2   lisinopril (ZESTRIL) 10 MG tablet Take 1 tablet (10 mg total) by mouth daily. 90 tablet 3   loratadine (CLARITIN) 10 MG tablet Take 1 tablet (10 mg total) by mouth daily. 90 tablet 1   morphine (MSIR) 15 MG tablet Take 15 mg by mouth 2 (two) times daily.     Multiple Vitamin (MULTIVITAMIN) capsule Take 1 capsule by mouth daily.     naloxone (NARCAN) nasal spray 4 mg/0.1 mL SMARTSIG:1 Spray(s) Both Nares PRN     NURTEC 75 MG TBDP Take 1 tablet (75 mg total) by mouth every other day. 16 tablet 5   omeprazole (PRILOSEC) 40 MG capsule Take 1 capsule (40 mg total) by mouth 2 (two) times daily. 180 capsule 1   oxybutynin (DITROPAN-XL) 10 MG 24 hr tablet TAKE 1 TABLET BY MOUTH EVERY DAY 90 tablet 1   pravastatin (PRAVACHOL) 40 MG tablet Take 1 tablet (40 mg total) by mouth every evening. 30 tablet 11   QUVIVIQ 50 MG TABS TAKE ONE TABLET BY MOUTH NIGHTLY WITHIN 30 MINUTES BEFORE GOING TO BED, AT LEAST 7 HOURS BEFORE WAKING 30 tablet 2   SUMAtriptan (IMITREX) 25 MG tablet Take by mouth.     SYMPROIC 0.2 MG TABS Take 0.2 mg by mouth daily.  2   Teriparatide, Recombinant, (FORTEO [place]) Inject 20 mg into the skin daily.     topiramate (TOPAMAX) 100 MG tablet Take 1.5 tablets (150 mg total) by mouth 2 (two) times daily. 270 tablet 2   traZODone (DESYREL) 50 MG tablet Take 1 tablet (50 mg total) by mouth at bedtime. 90 tablet 1   UBRELVY 100 MG TABS Take 1 tablet (100 mg total) by mouth 2 (two) times daily as needed. 10 tablet 2   UNABLE TO FIND Take 1 capsule by mouth daily. Med Name: stool softner     Erenumab-aooe (AIMOVIG) 140 MG/ML SOAJ Inject 140 mg into the skin every 30 (thirty) days.  (Patient not taking: Reported on 07/18/2022) 1.12 mL 5   lidocaine (LIDODERM) 5 % 1 patch daily. (Patient not taking: Reported on 03/08/2023)     ondansetron (ZOFRAN-ODT) 4 MG disintegrating tablet Take 1 tablet (4 mg total) by mouth every 6 (six) hours as needed for nausea or vomiting. 60 tablet 0   polyethylene glycol powder (GLYCOLAX/MIRALAX) 17 GM/SCOOP powder Take 1 Container by  mouth as needed.     promethazine (PHENERGAN) 25 MG tablet Take 1 tablet (25 mg total) by mouth every 6 (six) hours as needed for nausea or vomiting. 30 tablet 0   SODIUM FLUORIDE 5000 PPM 1.1 % GEL dental gel See admin instructions.     triamcinolone (KENALOG) 0.1 % paste Use as directed 1 application in the mouth or throat 4 (four) times daily. Mouth ulcer (Patient not taking: Reported on 07/18/2022)  1   Ubrogepant (UBRELVY) 100 MG TABS Take 1 tablet (100 mg total) by mouth 2 (two) times daily. (Patient not taking: Reported on 02/05/2023) 10 tablet 2   Current Facility-Administered Medications  Medication Dose Route Frequency Provider Last Rate Last Admin   0.9 %  sodium chloride infusion  500 mL Intravenous Once Lynann Bologna, MD        Allergies  Allergen Reactions   Penicillins     Has patient had a PCN reaction causing immediate rash, facial/tongue/throat swelling, SOB or lightheadedness with hypotension: no Has patient had a PCN reaction causing severe rash involving mucus membranes or skin necrosis: no Has patient had a PCN reaction that required hospitalization: no Has patient had a PCN reaction occurring within the last 10 years: no If all of the above answers are "NO", then may proceed with Cephalosporin use.    Bactrim [Sulfamethoxazole-Trimethoprim] Rash    Rash all over   Sulfamethoxazole    Trimethoprim     Review of Systems:  Constitutional: Denies fever, chills, diaphoresis, appetite change and has fatigue.  HEENT: Denies photophobia, eye pain, redness, hearing loss, ear pain, congestion,  sore throat, rhinorrhea, sneezing, mouth sores, neck pain, neck stiffness and tinnitus.   Respiratory: Denies SOB, DOE, cough, chest tightness,  and wheezing.   Cardiovascular: Denies chest pain, palpitations and leg swelling.  Genitourinary: Denies dysuria, urgency, frequency, hematuria, flank pain and difficulty urinating.  Musculoskeletal: Has myalgias, back pain, joint swelling, arthralgias and gait problem.  Skin: No rash.  Neurological: Denies dizziness, seizures, syncope, weakness, light-headedness, numbness and headaches.  Hematological: Denies adenopathy. Easy bruising, personal or family bleeding history  Psychiatric/Behavioral: Has anxiety or depression     Physical Exam:    BP (!) 147/100   Pulse 82   Temp 99.9 F (37.7 C)   Resp 14   Ht 5' (1.524 m)   Wt 136 lb (61.7 kg)   SpO2 100%   BMI 26.56 kg/m  Wt Readings from Last 3 Encounters:  03/08/23 136 lb (61.7 kg)  02/05/23 136 lb (61.7 kg)  01/01/23 126 lb 3.2 oz (57.2 kg)   Constitutional:  Well-developed, in no acute distress. Psychiatric: Normal mood and affect. Behavior is normal. HEENT: Pupils normal.  Conjunctivae are normal. No scleral icterus. Cardiovascular: Normal rate, regular rhythm. No edema Pulmonary/chest: Effort normal and breath sounds decreased.  No wheezing, rales or rhonchi. Abdominal: Soft, nondistended.  Lower abdominal tenderness.  No rebound.  Bowel sounds active throughout. There are no masses palpable. No hepatomegaly. Rectal: Deferred Neurological: Alert and oriented to person place and time. Skin: Skin is warm and dry. No rashes noted.  Data Reviewed: I have personally reviewed following labs and imaging studies  CBC:    Latest Ref Rng & Units 01/01/2023   10:17 AM 05/26/2008    3:04 PM  CBC  WBC 3.4 - 10.8 x10E3/uL 4.9  5.4   Hemoglobin 11.1 - 15.9 g/dL 87.5  64.3   Hematocrit 34.0 - 46.6 % 33.1  36.2   Platelets 150 -  450 x10E3/uL 318  132     CMP:    Latest Ref Rng & Units  01/01/2023   10:17 AM 05/26/2008    3:04 PM  CMP  Glucose 70 - 99 mg/dL 96  161   BUN 6 - 24 mg/dL 25  3   Creatinine 0.96 - 1.00 mg/dL 0.45  4.09   Sodium 811 - 144 mmol/L 140  135   Potassium 3.5 - 5.2 mmol/L 5.1  3.1   Chloride 96 - 106 mmol/L 106  105   CO2 20 - 29 mmol/L 18  19   Calcium 8.7 - 10.2 mg/dL 91.4  9.5   Total Protein 6.0 - 8.5 g/dL 7.2  6.8   Total Bilirubin 0.0 - 1.2 mg/dL <7.8  1.0   Alkaline Phos 44 - 121 IU/L 150  115   AST 0 - 40 IU/L 27  40   ALT 0 - 32 IU/L 12  16     GFR: CrCl cannot be calculated (Patient's most recent lab result is older than the maximum 21 days allowed.). Liver Function Tests: No results for input(s): "AST", "ALT", "ALKPHOS", "BILITOT", "PROT", "ALBUMIN" in the last 168 hours. No results for input(s): "LIPASE", "AMYLASE" in the last 168 hours. No results for input(s): "AMMONIA" in the last 168 hours. Coagulation Profile: No results for input(s): "INR", "PROTIME" in the last 168 hours. HbA1C: No results for input(s): "HGBA1C" in the last 72 hours. Lipid Profile: No results for input(s): "CHOL", "HDL", "LDLCALC", "TRIG", "CHOLHDL", "LDLDIRECT" in the last 72 hours. Thyroid Function Tests: No results for input(s): "TSH", "T4TOTAL", "FREET4", "T3FREE", "THYROIDAB" in the last 72 hours. Anemia Panel: No results for input(s): "VITAMINB12", "FOLATE", "FERRITIN", "TIBC", "IRON", "RETICCTPCT" in the last 72 hours.  No results found for this or any previous visit (from the past 240 hour(s)).    Radiology Studies: No results found.    Edman Circle, MD 03/08/2023, 1:38 PM  Cc: Crist Fat, MD

## 2023-03-08 NOTE — Progress Notes (Signed)
Called to room to assist during endoscopic procedure.  Patient ID and intended procedure confirmed with present staff. Received instructions for my participation in the procedure from the performing physician.  

## 2023-03-08 NOTE — Progress Notes (Signed)
Sedate, gd SR, tolerated procedure well, VSS, report to RN 

## 2023-03-08 NOTE — Op Note (Signed)
[facility] Patient Name: [name] Procedure Date: 03/08/2023 1:24 PM MRN: [id number] Endoscopist: Lynann Bologna , MD, [id number] Age: 57 Referring MD:  Date of Birth: 06/26/1965 Gender: Female Account #: [id number] Procedure:                Upper GI endoscopy Indications:              Iron deficiency anemia. GERD Medicines:                Monitored Anesthesia Care Procedure:                Pre-Anesthesia Assessment:                           - Prior to the procedure, a History and Physical                            was performed, and patient medications and                            allergies were reviewed. The patient's tolerance of                            previous anesthesia was also reviewed. The risks                            and benefits of the procedure and the sedation                            options and risks were discussed with the patient.                            All questions were answered, and informed consent                            was obtained. Prior Anticoagulants: The patient has                            taken no anticoagulant or antiplatelet agents. ASA                            Grade Assessment: II - A patient with mild systemic                            disease. After reviewing the risks and benefits,                            the patient was deemed in satisfactory condition to                            undergo the procedure.                           After obtaining informed consent, the endoscope was  passed under direct vision. Throughout the                            procedure, the patient's blood pressure, pulse, and                            oxygen saturations were monitored continuously. The                            Olympus Scope [id number] was introduced through the                            mouth, and advanced to the second part of duodenum.                            The upper GI  endoscopy was accomplished without                            difficulty. The patient tolerated the procedure                            well. Scope In: Scope Out: Findings:                 Patchy, white plaques were found in the lower third                            of the esophagus. Biopsies were taken with a cold                            forceps for histology.                           The Z-line was regular and was found 35 cm from the                            incisors.                           Localized mild inflammation was found in the                            gastric antrum. Biopsies were taken with a cold                            forceps for histology.                           The examined duodenum was normal. Biopsies for                            histology were taken with a cold forceps for                            evaluation of celiac disease. Complications:  No immediate complications. Estimated Blood Loss:     Estimated blood loss: none. Impression:               - Esophageal plaques were found, suspicious for                            candidiasis. Biopsied.                           - Z-line regular, 35 cm from the incisors.                           - Gastritis. Biopsied.                           - Normal examined duodenum. Biopsied. Recommendation:           - Patient has a contact number available for                            emergencies. The signs and symptoms of potential                            delayed complications were discussed with the                            patient. Return to normal activities tomorrow.                            Written discharge instructions were provided to the                            patient.                           - Resume previous diet.                           - Continue present medications.                           - Await pathology results.                           - The findings and  recommendations were discussed                            with the patient's family. Lynann Bologna, MD 03/08/2023 1:48:20 PM This report has been signed electronically.

## 2023-03-08 NOTE — Patient Instructions (Signed)
Educational handout provided to patient related to Hemorrhoids and Diverticulosis  Resume previous diet  Continue present medications  Please take Miralax 17g p.o. twice daily for now. Minimize pain medications.  Repeat colonoscopy in 5 years for screening purposes with 2 day prep.  Awaiting pathology results   YOU HAD AN ENDOSCOPIC PROCEDURE TODAY AT THE [facility]:   Refer to the procedure report that was given to you for any specific questions about what was found during the examination.  If the procedure report does not answer your questions, please call your gastroenterologist to clarify.  If you requested that your care partner not be given the details of your procedure findings, then the procedure report has been included in a sealed envelope for you to review at your convenience later.  YOU SHOULD EXPECT: Some feelings of bloating in the abdomen. Passage of more gas than usual.  Walking can help get rid of the air that was put into your GI tract during the procedure and reduce the bloating. If you had a lower endoscopy (such as a colonoscopy or flexible sigmoidoscopy) you may notice spotting of blood in your stool or on the toilet paper. If you underwent a bowel prep for your procedure, you may not have a normal bowel movement for a few days.  Please Note:  You might notice some irritation and congestion in your nose or some drainage.  This is from the oxygen used during your procedure.  There is no need for concern and it should clear up in a day or so.  SYMPTOMS TO REPORT IMMEDIATELY:  Following lower endoscopy (colonoscopy or flexible sigmoidoscopy):  Excessive amounts of blood in the stool  Significant tenderness or worsening of abdominal pains  Swelling of the abdomen that is new, acute  Fever of 100F or higher  Following upper endoscopy (EGD)  Vomiting of blood or coffee ground material  New chest pain or pain under the shoulder blades  Painful or  persistently difficult swallowing  New shortness of breath  Fever of 100F or higher  Black, tarry-looking stools  For urgent or emergent issues, a gastroenterologist can be reached at any hour by calling [phone]. Do not use MyChart messaging for urgent concerns.    DIET:  We do recommend a small meal at first, but then you may proceed to your regular diet.  Drink plenty of fluids but you should avoid alcoholic beverages for 24 hours.  ACTIVITY:  You should plan to take it easy for the rest of today and you should NOT DRIVE or use heavy machinery until tomorrow (because of the sedation medicines used during the test).    FOLLOW UP: Our staff will call the number listed on your records the next business day following your procedure.  We will call around 7:15- 8:00 am to check on you and address any questions or concerns that you may have regarding the information given to you following your procedure. If we do not reach you, we will leave a message.     If any biopsies were taken you will be contacted by phone or by letter within the next 1-3 weeks.  Please call us at [phone] if you have not heard about the biopsies in 3 weeks.    SIGNATURES/CONFIDENTIALITY: You and/or your care partner have signed paperwork which will be entered into your electronic medical record.  These signatures attest to the fact that that the information above on your After Visit Summary has been reviewed  and is understood.  Full responsibility of the confidentiality of this discharge information lies with you and/or your care-partner.

## 2023-03-11 ENCOUNTER — Telehealth: Payer: Self-pay

## 2023-03-11 NOTE — Telephone Encounter (Signed)
  Follow up Call-     03/08/2023   12:42 PM  Call back number  Post procedure Call Back phone  # [phone]  Permission to leave phone message No     Patient questions:  Do you have a fever, pain , or abdominal swelling? No. Pain Score  0 *  Have you tolerated food without any problems? Yes.    Have you been able to return to your normal activities? Yes.    Do you have any questions about your discharge instructions: Diet   No. Medications  No. Follow up visit  No.  Do you have questions or concerns about your Care? No.  Actions: * If pain score is 4 or above: No action needed, pain <4.

## 2023-03-12 DIAGNOSIS — M1712 Unilateral primary osteoarthritis, left knee: Secondary | ICD-10-CM | POA: Diagnosis not present

## 2023-03-14 LAB — SURGICAL PATHOLOGY

## 2023-03-16 ENCOUNTER — Encounter: Payer: Self-pay | Admitting: Gastroenterology

## 2023-03-26 DIAGNOSIS — S66011A Strain of long flexor muscle, fascia and tendon of right thumb at wrist and hand level, initial encounter: Secondary | ICD-10-CM | POA: Diagnosis not present

## 2023-03-27 DIAGNOSIS — S46211A Strain of muscle, fascia and tendon of other parts of biceps, right arm, initial encounter: Secondary | ICD-10-CM | POA: Diagnosis not present

## 2023-04-01 ENCOUNTER — Encounter: Payer: Self-pay | Admitting: Internal Medicine

## 2023-04-01 ENCOUNTER — Ambulatory Visit: Payer: Medicare Other | Admitting: Internal Medicine

## 2023-04-01 VITALS — BP 144/70 | HR 94 | Temp 98.6°F | Resp 20 | Ht 60.0 in | Wt 126.0 lb

## 2023-04-01 DIAGNOSIS — R222 Localized swelling, mass and lump, trunk: Secondary | ICD-10-CM | POA: Diagnosis not present

## 2023-04-01 DIAGNOSIS — I10 Essential (primary) hypertension: Secondary | ICD-10-CM

## 2023-04-01 DIAGNOSIS — D649 Anemia, unspecified: Secondary | ICD-10-CM | POA: Diagnosis not present

## 2023-04-01 DIAGNOSIS — E7800 Pure hypercholesterolemia, unspecified: Secondary | ICD-10-CM | POA: Diagnosis not present

## 2023-04-01 MED ORDER — LISINOPRIL 20 MG PO TABS: 20.0000 mg | ORAL_TABLET | Freq: Every day | ORAL | 3 refills | Status: DC

## 2023-04-01 MED ORDER — AZELASTINE HCL 137 MCG/SPRAY NA SOLN: NASAL | 2 refills | Status: DC

## 2023-04-01 MED ORDER — QUVIVIQ 50 MG PO TABS: ORAL_TABLET | ORAL | 0 refills | Status: DC

## 2023-04-01 NOTE — Assessment & Plan Note (Signed)
She does not want to take any choleserol meds.  She understands the risk with her history of known aortic atherosclerosis.

## 2023-04-01 NOTE — Progress Notes (Signed)
Office Visit  Subjective   Patient ID: [name]   DOB: Aug 23, 1965   Age: 56 y.o.   MRN: [id number]   Chief Complaint Chief Complaint  Patient presents with   Follow-up    3 month follow up.     History of Present Illness The patient is a 57 year old Caucasian/White female who presents for a follow-up evaluation of hypertension.  She was noted on her last visit that her BP was borderline.  The patient has been checking her blood pressure at home.  She states her systolic BP is running in the 782'N. The patient's current medications include: lisinopril 10 mg daily.   The patient has been tolerating her medications well. The patient denies any headaches, dizziness, blurred vision, chest pain, palpitatons, generalized weakness, shortness of breath, or orthopnea. She reports there have been no other symptoms noted.   The patient also has a history of a pleural nodule where on a CT scan of her abd/pelvis done on 05/07/2021 and this showed chronic biliary duct distension but with subtle added density in the distal common bile duct raising the question of a stone perhaps as large is 1 cm greatest dimension.   There was a spiculated RIGHT lower lobe nodule along the pleural surface appears to have contracted slightly since previous imaging from July where it measured 22 x 19 mm. Differential considerations are scarring from prior pneumonia or pulmonary infarct. Neoplasm while less likely is not entirely excluded. Consider short interval follow-up to ensure resolution. Would also correlate with patient history. PET imaging may be of benefit if there is no history of respiratory symptoms.  There is an unchanged appearance of L2 compression fracture.  There was also aortic atherosclerosis. I did get her setup to have another CT scan of her chest but the patient cancelled that appointment as she was sick and never made another appointment.    We also noted on her yearly labs that she was anemic in  12/2022 where her HgB was 10.4.  We sent her to GI where they did an EGD and colonoscopy which were done on 03/08/2023.  Her colonoscopy whowed mild sigmoid diverticulosis with non-bleeding internal hemorrhoids. The examination was otherwise normal on direct and retroflexion views.  Her EGD showed esophageal plaques, suspicious for candidiasis. She had gastritis but normal duodenum.   The patient was also noted on her yearly labs in 12/2022 to have an elevated cholesterol level.  Since she has aortic atherosclerosis, I asked her to start pravastatin 40mg  at bedtime.  She states she never did this and she knows she is at risk for atherosclerotic disease.  She states multiple family members have had myalgias to statins and she did not want to start this.   Overall, she states she is doing well and is without any complaints or problems at this time. She specifically denies abdominal pain, nausea, vomiting, diarrhea, myalgias, and fatigue. She remains on dietary management. She is fasting in anticipation for labs today.      Past Medical History Past Medical History:  Diagnosis Date   Allergic rhinitis    Anemia    Arthritis    Cataract    Chronic pain syndrome    Depression    GERD (gastroesophageal reflux disease)    Headache    migraines   Hypertension    Migraines    Osteoporosis    Subdural hematoma (HCC)    Traumatic brain injury (HCC)      Allergies  Allergies  Allergen Reactions   Penicillins     Has patient had a PCN reaction causing immediate rash, facial/tongue/throat swelling, SOB or lightheadedness with hypotension: no Has patient had a PCN reaction causing severe rash involving mucus membranes or skin necrosis: no Has patient had a PCN reaction that required hospitalization: no Has patient had a PCN reaction occurring within the last 10 years: no If all of the above answers are "NO", then may proceed with Cephalosporin use.    Bactrim [Sulfamethoxazole-Trimethoprim] Rash     Rash all over   Sulfamethoxazole    Trimethoprim      Medications  Current Outpatient Medications:    Azelastine HCl 137 MCG/SPRAY SOLN, INSTILL 2 SPRAYS IN EACH NOSTRIL TWICE DAILY FOR 20 DAYS, Disp: 0.137 mL, Rfl: 2   Calcium Carb-Cholecalciferol (CALCIUM+D3) 600-800 MG-UNIT TABS, Take 1 tablet by mouth 2 (two) times daily., Disp: , Rfl:    Erenumab-aooe (AIMOVIG) 140 MG/ML SOAJ, Inject 140 mg into the skin every 30 (thirty) days. (Patient not taking: Reported on 07/18/2022), Disp: 1.12 mL, Rfl: 5   fentaNYL (DURAGESIC) 25 MCG/HR, Place 1 patch onto the skin every 3 (three) days., Disp: , Rfl:    fluticasone (FLONASE) 50 MCG/ACT nasal spray, Place 1 spray into both nostrils in the morning and at bedtime., Disp: 48 mL, Rfl: 0   gabapentin (NEURONTIN) 800 MG tablet, Take 800 mg by mouth 3 (three) times daily., Disp: , Rfl:    hydrOXYzine (ATARAX) 50 MG tablet, TAKE 1 TABLET BY MOUTH EVERY DAY, Disp: 90 tablet, Rfl: 2   lidocaine (LIDODERM) 5 %, 1 patch daily. (Patient not taking: Reported on 03/08/2023), Disp: , Rfl:    loratadine (CLARITIN) 10 MG tablet, Take 1 tablet (10 mg total) by mouth daily., Disp: 90 tablet, Rfl: 1   morphine (MSIR) 15 MG tablet, Take 15 mg by mouth 2 (two) times daily., Disp: , Rfl:    Multiple Vitamin (MULTIVITAMIN) capsule, Take 1 capsule by mouth daily., Disp: , Rfl:    naloxone (NARCAN) nasal spray 4 mg/0.1 mL, SMARTSIG:1 Spray(s) Both Nares PRN, Disp: , Rfl:    NURTEC 75 MG TBDP, Take 1 tablet (75 mg total) by mouth every other day., Disp: 16 tablet, Rfl: 5   omeprazole (PRILOSEC) 40 MG capsule, Take 1 capsule (40 mg total) by mouth 2 (two) times daily., Disp: 180 capsule, Rfl: 1   ondansetron (ZOFRAN-ODT) 4 MG disintegrating tablet, Take 1 tablet (4 mg total) by mouth every 6 (six) hours as needed for nausea or vomiting., Disp: 60 tablet, Rfl: 0   oxybutynin (DITROPAN-XL) 10 MG 24 hr tablet, TAKE 1 TABLET BY MOUTH EVERY DAY, Disp: 90 tablet, Rfl: 1   polyethylene  glycol powder (GLYCOLAX/MIRALAX) 17 GM/SCOOP powder, Take 1 Container by mouth as needed., Disp: , Rfl:    pravastatin (PRAVACHOL) 40 MG tablet, Take 1 tablet (40 mg total) by mouth every evening., Disp: 30 tablet, Rfl: 11   promethazine (PHENERGAN) 25 MG tablet, Take 1 tablet (25 mg total) by mouth every 6 (six) hours as needed for nausea or vomiting., Disp: 30 tablet, Rfl: 0   QUVIVIQ 50 MG TABS, TAKE ONE TABLET BY MOUTH NIGHTLY WITHIN 30 MINUTES BEFORE GOING TO BED, AT LEAST 7 HOURS BEFORE WAKING, Disp: 30 tablet, Rfl: 2   SODIUM FLUORIDE 5000 PPM 1.1 % GEL dental gel, See admin instructions., Disp: , Rfl:    SUMAtriptan (IMITREX) 25 MG tablet, Take by mouth., Disp: , Rfl:    SYMPROIC 0.2 MG  TABS, Take 0.2 mg by mouth daily., Disp: , Rfl: 2   Teriparatide, Recombinant, (FORTEO Fowler), Inject 20 mg into the skin daily., Disp: , Rfl:    topiramate (TOPAMAX) 100 MG tablet, Take 1.5 tablets (150 mg total) by mouth 2 (two) times daily., Disp: 270 tablet, Rfl: 2   traZODone (DESYREL) 50 MG tablet, Take 1 tablet (50 mg total) by mouth at bedtime., Disp: 90 tablet, Rfl: 1   UBRELVY 100 MG TABS, Take 1 tablet (100 mg total) by mouth 2 (two) times daily as needed., Disp: 10 tablet, Rfl: 2   UNABLE TO FIND, Take 1 capsule by mouth daily. Med Name: stool softner, Disp: , Rfl:    Review of Systems Review of Systems  Constitutional:  Negative for chills, fever and malaise/fatigue.  Eyes:  Negative for blurred vision and double vision.  Respiratory:  Negative for cough and shortness of breath.   Cardiovascular:  Negative for chest pain, palpitations and leg swelling.  Gastrointestinal:  Negative for abdominal pain, constipation, diarrhea, nausea and vomiting.  Genitourinary:  Negative for frequency.  Musculoskeletal:  Negative for myalgias.  Skin:  Negative for itching and rash.  Neurological:  Negative for dizziness, weakness and headaches.  Endo/Heme/Allergies:  Negative for polydipsia.        Objective:    Vitals BP (!) 144/70 (BP Location: Left Arm, Patient Position: Sitting)   Pulse 94   Temp 98.6 F (37 C) (Temporal)   Resp 20   Ht 5' (1.524 m)   Wt 126 lb (57.2 kg)   SpO2 99%   BMI 24.61 kg/m    Physical Examination Physical Exam Constitutional:      Appearance: Normal appearance. She is not ill-appearing.  Cardiovascular:     Rate and Rhythm: Normal rate and regular rhythm.     Pulses: Normal pulses.     Heart sounds: No murmur heard.    No friction rub. No gallop.  Pulmonary:     Effort: Pulmonary effort is normal. No respiratory distress.     Breath sounds: No wheezing, rhonchi or rales.  Abdominal:     General: Bowel sounds are normal. There is no distension.     Palpations: Abdomen is soft.     Tenderness: There is no abdominal tenderness.  Musculoskeletal:     Right lower leg: No edema.     Left lower leg: No edema.  Skin:    General: Skin is warm and dry.     Findings: No rash.  Neurological:     General: No focal deficit present.     Mental Status: She is alert and oriented to person, place, and time.  Psychiatric:        Mood and Affect: Mood normal.        Behavior: Behavior normal.        Assessment & Plan:   Essential hypertension Her BP is elevated.  I am going to increase her lisinopril from 10mg  to 20mg  daily.  Anemia We will repeat her CBC and do iron studies today.  Her endoscopy looked normal.  Hypercholesterolemia She does not want to take any choleserol meds.  She understands the risk with her history of known aortic atherosclerosis.  Pleural nodule I had another discussion with her regarding this.  I want her to call them and reschedule her CT scan like she said she was going to do.    Return in about 3 months (around 06/30/2023).   Crist Fat, MD

## 2023-04-01 NOTE — Assessment & Plan Note (Signed)
I had another discussion with her regarding this.  I want her to call them and reschedule her CT scan like she said she was going to do.

## 2023-04-01 NOTE — Assessment & Plan Note (Signed)
We will repeat her CBC and do iron studies today.  Her endoscopy looked normal.

## 2023-04-01 NOTE — Assessment & Plan Note (Signed)
Her BP is elevated.  I am going to increase her lisinopril from 10mg  to 20mg  daily.

## 2023-04-02 LAB — CBC WITH DIFFERENTIAL/PLATELET
Basophils Absolute: 0.1 10*3/uL (ref 0.0–0.2)
Basos: 1 %
EOS (ABSOLUTE): 0.1 10*3/uL (ref 0.0–0.4)
Eos: 3 %
Hematocrit: 32.9 % — ABNORMAL LOW (ref 34.0–46.6)
Hemoglobin: 10.5 g/dL — ABNORMAL LOW (ref 11.1–15.9)
Immature Grans (Abs): 0 10*3/uL (ref 0.0–0.1)
Immature Granulocytes: 1 %
Lymphocytes Absolute: 1.8 10*3/uL (ref 0.7–3.1)
Lymphs: 37 %
MCH: 28 pg (ref 26.6–33.0)
MCHC: 31.9 g/dL (ref 31.5–35.7)
MCV: 88 fL (ref 79–97)
Monocytes Absolute: 0.4 10*3/uL (ref 0.1–0.9)
Monocytes: 8 %
Neutrophils Absolute: 2.5 10*3/uL (ref 1.4–7.0)
Neutrophils: 50 %
Platelets: 400 10*3/uL (ref 150–450)
RBC: 3.75 x10E6/uL — ABNORMAL LOW (ref 3.77–5.28)
RDW: 16.9 % — ABNORMAL HIGH (ref 11.7–15.4)
WBC: 4.8 10*3/uL (ref 3.4–10.8)

## 2023-04-02 LAB — CMP14 + ANION GAP
ALT: 10 [IU]/L (ref 0–32)
AST: 21 [IU]/L (ref 0–40)
Albumin: 4.2 g/dL (ref 3.8–4.9)
Alkaline Phosphatase: 132 [IU]/L — ABNORMAL HIGH (ref 44–121)
Anion Gap: 15 mmol/L (ref 10.0–18.0)
BUN/Creatinine Ratio: 12 (ref 9–23)
BUN: 12 mg/dL (ref 6–24)
Bilirubin Total: <0.2 mg/dL (ref 0.0–1.2)
CO2: 18 mmol/L — ABNORMAL LOW (ref 20–29)
Calcium: 10 mg/dL (ref 8.7–10.2)
Chloride: 102 mmol/L (ref 96–106)
Creatinine, Ser: 0.98 mg/dL (ref 0.57–1.00)
Globulin, Total: 2.7 g/dL (ref 1.5–4.5)
Glucose: 94 mg/dL (ref 70–99)
Potassium: 4.4 mmol/L (ref 3.5–5.2)
Sodium: 135 mmol/L (ref 134–144)
Total Protein: 6.9 g/dL (ref 6.0–8.5)
eGFR: 67 mL/min/{1.73_m2} (ref >59)

## 2023-04-02 LAB — FERRITIN: Ferritin: 23 ng/mL (ref 15–150)

## 2023-04-02 LAB — IRON: Iron: 44 ug/dL (ref 27–159)

## 2023-04-04 DIAGNOSIS — M47816 Spondylosis without myelopathy or radiculopathy, lumbar region: Secondary | ICD-10-CM | POA: Diagnosis not present

## 2023-04-04 DIAGNOSIS — M48061 Spinal stenosis, lumbar region without neurogenic claudication: Secondary | ICD-10-CM | POA: Diagnosis not present

## 2023-04-04 DIAGNOSIS — Z79891 Long term (current) use of opiate analgesic: Secondary | ICD-10-CM | POA: Diagnosis not present

## 2023-04-04 DIAGNOSIS — M25531 Pain in right wrist: Secondary | ICD-10-CM | POA: Diagnosis not present

## 2023-04-04 DIAGNOSIS — M19131 Post-traumatic osteoarthritis, right wrist: Secondary | ICD-10-CM | POA: Diagnosis not present

## 2023-04-04 DIAGNOSIS — M5416 Radiculopathy, lumbar region: Secondary | ICD-10-CM | POA: Diagnosis not present

## 2023-04-04 DIAGNOSIS — G894 Chronic pain syndrome: Secondary | ICD-10-CM | POA: Diagnosis not present

## 2023-04-04 DIAGNOSIS — M51369 Other intervertebral disc degeneration, lumbar region without mention of lumbar back pain or lower extremity pain: Secondary | ICD-10-CM | POA: Diagnosis not present

## 2023-04-08 DIAGNOSIS — R918 Other nonspecific abnormal finding of lung field: Secondary | ICD-10-CM | POA: Diagnosis not present

## 2023-04-12 DIAGNOSIS — G8929 Other chronic pain: Secondary | ICD-10-CM | POA: Diagnosis not present

## 2023-04-12 DIAGNOSIS — M25531 Pain in right wrist: Secondary | ICD-10-CM | POA: Diagnosis not present

## 2023-04-16 DIAGNOSIS — R918 Other nonspecific abnormal finding of lung field: Secondary | ICD-10-CM | POA: Diagnosis not present

## 2023-04-16 DIAGNOSIS — Z20822 Contact with and (suspected) exposure to covid-19: Secondary | ICD-10-CM | POA: Diagnosis not present

## 2023-04-22 NOTE — Progress Notes (Signed)
Her iron studies look good and her blood count is stable.   Patient aware

## 2023-05-02 DIAGNOSIS — M19131 Post-traumatic osteoarthritis, right wrist: Secondary | ICD-10-CM | POA: Diagnosis not present

## 2023-05-02 DIAGNOSIS — M5416 Radiculopathy, lumbar region: Secondary | ICD-10-CM | POA: Diagnosis not present

## 2023-05-02 DIAGNOSIS — M48061 Spinal stenosis, lumbar region without neurogenic claudication: Secondary | ICD-10-CM | POA: Diagnosis not present

## 2023-05-02 DIAGNOSIS — M51369 Other intervertebral disc degeneration, lumbar region without mention of lumbar back pain or lower extremity pain: Secondary | ICD-10-CM | POA: Diagnosis not present

## 2023-05-02 DIAGNOSIS — M47816 Spondylosis without myelopathy or radiculopathy, lumbar region: Secondary | ICD-10-CM | POA: Diagnosis not present

## 2023-05-02 DIAGNOSIS — M25531 Pain in right wrist: Secondary | ICD-10-CM | POA: Diagnosis not present

## 2023-05-02 DIAGNOSIS — Z1389 Encounter for screening for other disorder: Secondary | ICD-10-CM | POA: Diagnosis not present

## 2023-05-04 ENCOUNTER — Other Ambulatory Visit: Payer: Self-pay | Admitting: Internal Medicine

## 2023-05-21 ENCOUNTER — Encounter: Payer: Self-pay | Admitting: Internal Medicine

## 2023-05-28 ENCOUNTER — Other Ambulatory Visit: Payer: Self-pay | Admitting: Internal Medicine

## 2023-05-30 ENCOUNTER — Other Ambulatory Visit: Payer: Self-pay | Admitting: Internal Medicine

## 2023-05-30 DIAGNOSIS — M5416 Radiculopathy, lumbar region: Secondary | ICD-10-CM | POA: Diagnosis not present

## 2023-05-30 DIAGNOSIS — M47816 Spondylosis without myelopathy or radiculopathy, lumbar region: Secondary | ICD-10-CM | POA: Diagnosis not present

## 2023-05-30 DIAGNOSIS — M48061 Spinal stenosis, lumbar region without neurogenic claudication: Secondary | ICD-10-CM | POA: Diagnosis not present

## 2023-05-30 DIAGNOSIS — M19131 Post-traumatic osteoarthritis, right wrist: Secondary | ICD-10-CM | POA: Diagnosis not present

## 2023-05-30 DIAGNOSIS — M51369 Other intervertebral disc degeneration, lumbar region without mention of lumbar back pain or lower extremity pain: Secondary | ICD-10-CM | POA: Diagnosis not present

## 2023-05-30 DIAGNOSIS — G894 Chronic pain syndrome: Secondary | ICD-10-CM | POA: Diagnosis not present

## 2023-05-30 DIAGNOSIS — M25531 Pain in right wrist: Secondary | ICD-10-CM | POA: Diagnosis not present

## 2023-05-30 MED ORDER — QUVIVIQ 50 MG PO TABS: ORAL_TABLET | ORAL | 1 refills | Status: DC

## 2023-06-24 DIAGNOSIS — R109 Unspecified abdominal pain: Secondary | ICD-10-CM | POA: Diagnosis not present

## 2023-06-25 DIAGNOSIS — K838 Other specified diseases of biliary tract: Secondary | ICD-10-CM | POA: Diagnosis not present

## 2023-07-01 ENCOUNTER — Other Ambulatory Visit: Payer: Self-pay | Admitting: Student

## 2023-07-01 ENCOUNTER — Encounter: Payer: Self-pay | Admitting: Student

## 2023-07-01 ENCOUNTER — Ambulatory Visit: Payer: Medicare Other | Admitting: Student

## 2023-07-01 VITALS — BP 140/82 | Temp 98.0°F | Resp 18 | Ht 60.0 in | Wt 128.0 lb

## 2023-07-01 DIAGNOSIS — I10 Essential (primary) hypertension: Secondary | ICD-10-CM | POA: Diagnosis not present

## 2023-07-01 DIAGNOSIS — G43909 Migraine, unspecified, not intractable, without status migrainosus: Secondary | ICD-10-CM | POA: Diagnosis not present

## 2023-07-01 DIAGNOSIS — K219 Gastro-esophageal reflux disease without esophagitis: Secondary | ICD-10-CM | POA: Diagnosis not present

## 2023-07-01 DIAGNOSIS — J302 Other seasonal allergic rhinitis: Secondary | ICD-10-CM

## 2023-07-01 DIAGNOSIS — N3281 Overactive bladder: Secondary | ICD-10-CM | POA: Diagnosis not present

## 2023-07-01 MED ORDER — OMEPRAZOLE 40 MG PO CPDR: 40.0000 mg | DELAYED_RELEASE_CAPSULE | Freq: Two times a day (BID) | ORAL | 1 refills | Status: DC

## 2023-07-01 MED ORDER — ONDANSETRON 4 MG PO TBDP: 4.0000 mg | ORAL_TABLET | Freq: Four times a day (QID) | ORAL | 0 refills | Status: DC | PRN

## 2023-07-01 MED ORDER — UBRELVY 100 MG PO TABS: 1.0000 | ORAL_TABLET | Freq: Two times a day (BID) | ORAL | 2 refills | Status: DC | PRN

## 2023-07-01 MED ORDER — AZELASTINE HCL 137 MCG/SPRAY NA SOLN: NASAL | 2 refills | Status: DC

## 2023-07-01 MED ORDER — OXYBUTYNIN CHLORIDE ER 10 MG PO TB24: 10.0000 mg | ORAL_TABLET | Freq: Every day | ORAL | 2 refills | Status: DC

## 2023-07-01 NOTE — Patient Instructions (Addendum)
 Keep a blood pressure journal checking before your meds then 1 hour after.  Your goal is to be below 130/90  Blood pressure log Date: _______________________ a.m. _____________________(1st reading) _____________________(2nd reading) p.m. _____________________(1st reading) _____________________(2nd reading) Date: _______________________ a.m. _____________________(1st reading) _____________________(2nd reading) p.m. _____________________(1st reading) _____________________(2nd reading) Date: _______________________ a.m. _____________________(1st reading) _____________________(2nd reading) p.m. _____________________(1st reading) _____________________(2nd reading) Date: _______________________ a.m. _____________________(1st reading) _____________________(2nd reading) p.m. _____________________(1st reading) _____________________(2nd reading) Date: _______________________ a.m. _____________________(1st reading) _____________________(2nd reading) p.m. _____________________(1st reading) _____________________(2nd reading)

## 2023-07-01 NOTE — Assessment & Plan Note (Addendum)
 Her migraines have increased in the last 2 weeks.  Her symptoms have been controlled on  Nurtec and Ubrelvy.  We discussed symptom management and using Ubrelvy for the breakthrough migraine.  Refill for Ubrelvy.

## 2023-07-01 NOTE — Assessment & Plan Note (Addendum)
 Her blood pressure is slightly elevated today, at recheck 140/82. She notes just taking bp med 1 hour prior to the visit. She also reports being in pain. I want her to start keeping a diary of readings for 2 weeks.  If blood pressures continue to be elevated we will need to adjust therapy, possibly adding another antihypertensive.  We discussed goal blood pressure less than 130/90.

## 2023-07-01 NOTE — Assessment & Plan Note (Signed)
Refill for Prilosec.

## 2023-07-01 NOTE — Progress Notes (Signed)
 Established Patient Office Visit  Subjective   Patient ID: TEARSA KOWALEWSKI, female    DOB: December 31, 1965  Age: 58 y.o. MRN: [id number]  Chief Complaint  Patient presents with   Follow-up    3 month follow up    HPI  [name] is a 58 year old female presenting for 3 month follow up.   The patient has history of hypertension. At her last visit  Lisinopril was increased to 20 mg once daily for tighter control. The patient has been checking her blood pressure at home. She states her systolic BP is running in the 409-811'B with diastolic readings 90-94.  The patient has been tolerating her medications well. The patient denies any headaches, dizziness, blurred vision, chest pain, palpitatons, generalized weakness, shortness of breath, or orthopnea. She reports there have been no other symptoms noted.   The patient has history of overactive bladder where she is using oxybutynin 10 mg once daily for many years. She reports this being very helpful in slowing up urine leakage and she denies any issues with medication.  GI where they did an EGD and colonoscopy which were done on 03/08/2023. Her colonoscopy showed mild sigmoid diverticulosis with non-bleeding internal hemorrhoids. The examination was otherwise normal on direct and retroflexion views. Her EGD showed esophageal plaques, suspicious for candidiasis.  Her reflux has been managed successfully on Prilosec 40 mg BID.  Denies any complaints.  The patient has a history of chronic migraines.  She reports migraines have increased with in the last week, due to season changes. She is using nurtec 75 mg every other day for prevention and Ubrelvy 100 mg for break through migraines.  The patient denies blurred vision, photophobia, or dizziness.  She feels that her migraines are controlled on this regimen.  She is no longer taking Topamax or sumatriptan.  She is requesting refill of ondansetron 4 mg for nausea related to chronic pain and  migraines.   Patient has known history of seasonal allergies and septal deviation with a significant perforation.  She has been using combination of Azelastine nasal spray, Flonase, saline nasal spray, and Claritin.  Today she reports feeling stuffed up and dry, she denies changes is nasal discharge.   Review of Systems  Constitutional: Negative.   HENT: Negative.    Eyes: Negative.   Respiratory: Negative.    Cardiovascular: Negative.   Gastrointestinal:  Positive for heartburn.  Genitourinary: Negative.   Musculoskeletal: Negative.   Skin: Negative.   Neurological:  Positive for headaches.  Endo/Heme/Allergies: Negative.   Psychiatric/Behavioral: Negative.        Objective:     BP (!) 140/82 (BP Location: Left Arm, Patient Position: Sitting, Cuff Size: Normal)   Temp 98 F (36.7 C)   Resp 18   Ht 5' (1.524 m)   Wt 128 lb (58.1 kg)   BMI 25.00 kg/m    Physical Exam Constitutional:      Appearance: Normal appearance.  HENT:     Head: Normocephalic.     Nose: Nasal deformity and septal deviation present.     Right Turbinates: Swollen.     Left Turbinates: Swollen.     Mouth/Throat:     Mouth: Mucous membranes are moist.  Eyes:     Conjunctiva/sclera: Conjunctivae normal.     Pupils: Pupils are equal, round, and reactive to light.  Cardiovascular:     Rate and Rhythm: Normal rate and regular rhythm.     Pulses: Normal pulses.  Heart sounds: Normal heart sounds.  Pulmonary:     Effort: Pulmonary effort is normal.     Breath sounds: Normal breath sounds.  Abdominal:     General: Abdomen is flat. Bowel sounds are normal.     Palpations: Abdomen is soft.  Skin:    General: Skin is warm and dry.     Capillary Refill: Capillary refill takes less than 2 seconds.  Neurological:     General: No focal deficit present.     Mental Status: She is alert and oriented to person, place, and time.  Psychiatric:        Mood and Affect: Mood normal.        Behavior:  Behavior normal.      No results found for any visits on 07/01/23.    The 10-year ASCVD risk score (Arnett DK, et al., 2019) is: 3.7%    Assessment & Plan:   Problem List Items Addressed This Visit     Seasonal allergies   Refill for azelastine nasal spray. She was discouraged from using both Flonase and azelastine at the same time to prevent rebound effects.       Essential hypertension - Primary   Her blood pressure is slightly elevated today, at recheck 140/82. She notes just taking bp med 1 hour prior to the visit. She also reports being in pain. I want her to start keeping a diary of readings for 2 weeks.  If blood pressures continue to be elevated we will need to adjust therapy, possibly adding another antihypertensive.  We discussed goal blood pressure less than 130/90.       Migraine, unspecified, not intractable, without status migrainosus   Her migraines have increased in the last 2 weeks.  Her symptoms have been controlled on  Nurtec and Ubrelvy.  We discussed symptom management and using Ubrelvy for the breakthrough migraine.  Refill for Ubrelvy.      Relevant Medications   UBRELVY 100 MG TABS   Overactive bladder   Refill for oxybutynin      Gastro-esophageal reflux disease without esophagitis   Refill for Prilosec       Relevant Medications   omeprazole (PRILOSEC) 40 MG capsule   ondansetron (ZOFRAN-ODT) 4 MG disintegrating tablet    No follow-ups on file.    Edwena Blow, NP

## 2023-07-01 NOTE — Assessment & Plan Note (Signed)
 Refill for azelastine nasal spray. She was discouraged from using both Flonase and azelastine at the same time to prevent rebound effects.

## 2023-07-01 NOTE — Assessment & Plan Note (Signed)
 Refill for oxybutynin.

## 2023-07-04 DIAGNOSIS — M19131 Post-traumatic osteoarthritis, right wrist: Secondary | ICD-10-CM | POA: Diagnosis not present

## 2023-07-04 DIAGNOSIS — M51369 Other intervertebral disc degeneration, lumbar region without mention of lumbar back pain or lower extremity pain: Secondary | ICD-10-CM | POA: Diagnosis not present

## 2023-07-04 DIAGNOSIS — M5416 Radiculopathy, lumbar region: Secondary | ICD-10-CM | POA: Diagnosis not present

## 2023-07-04 DIAGNOSIS — M25531 Pain in right wrist: Secondary | ICD-10-CM | POA: Diagnosis not present

## 2023-07-04 DIAGNOSIS — G894 Chronic pain syndrome: Secondary | ICD-10-CM | POA: Diagnosis not present

## 2023-07-04 DIAGNOSIS — M48061 Spinal stenosis, lumbar region without neurogenic claudication: Secondary | ICD-10-CM | POA: Diagnosis not present

## 2023-07-04 DIAGNOSIS — M47816 Spondylosis without myelopathy or radiculopathy, lumbar region: Secondary | ICD-10-CM | POA: Diagnosis not present

## 2023-07-11 ENCOUNTER — Encounter: Payer: Self-pay | Admitting: Internal Medicine

## 2023-07-16 DIAGNOSIS — G8929 Other chronic pain: Secondary | ICD-10-CM | POA: Diagnosis not present

## 2023-07-16 DIAGNOSIS — M25531 Pain in right wrist: Secondary | ICD-10-CM | POA: Diagnosis not present

## 2023-07-17 DIAGNOSIS — M1712 Unilateral primary osteoarthritis, left knee: Secondary | ICD-10-CM | POA: Diagnosis not present

## 2023-07-27 ENCOUNTER — Other Ambulatory Visit: Payer: Self-pay | Admitting: Internal Medicine

## 2023-08-06 ENCOUNTER — Other Ambulatory Visit: Payer: Self-pay | Admitting: Internal Medicine

## 2023-08-16 ENCOUNTER — Other Ambulatory Visit: Payer: Self-pay | Admitting: Internal Medicine

## 2023-08-21 ENCOUNTER — Other Ambulatory Visit: Payer: Self-pay | Admitting: Internal Medicine

## 2023-08-22 ENCOUNTER — Other Ambulatory Visit: Payer: Self-pay | Admitting: Internal Medicine

## 2023-08-22 MED ORDER — QUVIVIQ 50 MG PO TABS: ORAL_TABLET | ORAL | 1 refills | Status: DC

## 2023-08-29 DIAGNOSIS — M51369 Other intervertebral disc degeneration, lumbar region without mention of lumbar back pain or lower extremity pain: Secondary | ICD-10-CM | POA: Diagnosis not present

## 2023-08-29 DIAGNOSIS — M5416 Radiculopathy, lumbar region: Secondary | ICD-10-CM | POA: Diagnosis not present

## 2023-08-29 DIAGNOSIS — M25531 Pain in right wrist: Secondary | ICD-10-CM | POA: Diagnosis not present

## 2023-08-29 DIAGNOSIS — M48061 Spinal stenosis, lumbar region without neurogenic claudication: Secondary | ICD-10-CM | POA: Diagnosis not present

## 2023-08-29 DIAGNOSIS — M47816 Spondylosis without myelopathy or radiculopathy, lumbar region: Secondary | ICD-10-CM | POA: Diagnosis not present

## 2023-08-29 DIAGNOSIS — M19131 Post-traumatic osteoarthritis, right wrist: Secondary | ICD-10-CM | POA: Diagnosis not present

## 2023-09-02 ENCOUNTER — Other Ambulatory Visit: Payer: Self-pay | Admitting: Internal Medicine

## 2023-09-06 ENCOUNTER — Other Ambulatory Visit: Payer: Self-pay | Admitting: Internal Medicine

## 2023-10-17 ENCOUNTER — Other Ambulatory Visit: Payer: Self-pay

## 2023-10-17 DIAGNOSIS — G8929 Other chronic pain: Secondary | ICD-10-CM | POA: Diagnosis not present

## 2023-10-17 DIAGNOSIS — M25531 Pain in right wrist: Secondary | ICD-10-CM | POA: Diagnosis not present

## 2023-10-17 MED ORDER — ONDANSETRON 4 MG PO TBDP: 4.0000 mg | ORAL_TABLET | Freq: Four times a day (QID) | ORAL | 0 refills | Status: DC | PRN

## 2023-10-17 MED ORDER — UBRELVY 100 MG PO TABS: 1.0000 | ORAL_TABLET | Freq: Two times a day (BID) | ORAL | 2 refills | Status: DC | PRN

## 2023-10-17 NOTE — Progress Notes (Signed)
 Rx Refill

## 2023-10-18 ENCOUNTER — Other Ambulatory Visit: Payer: Self-pay

## 2023-10-18 DIAGNOSIS — M1712 Unilateral primary osteoarthritis, left knee: Secondary | ICD-10-CM | POA: Diagnosis not present

## 2023-10-18 MED ORDER — ONDANSETRON 4 MG PO TBDP: 4.0000 mg | ORAL_TABLET | Freq: Four times a day (QID) | ORAL | 0 refills | Status: DC | PRN

## 2023-10-23 DIAGNOSIS — M5416 Radiculopathy, lumbar region: Secondary | ICD-10-CM | POA: Diagnosis not present

## 2023-10-23 DIAGNOSIS — M25531 Pain in right wrist: Secondary | ICD-10-CM | POA: Diagnosis not present

## 2023-10-23 DIAGNOSIS — M461 Sacroiliitis, not elsewhere classified: Secondary | ICD-10-CM | POA: Diagnosis not present

## 2023-10-23 DIAGNOSIS — M51369 Other intervertebral disc degeneration, lumbar region without mention of lumbar back pain or lower extremity pain: Secondary | ICD-10-CM | POA: Diagnosis not present

## 2023-10-23 DIAGNOSIS — Z8781 Personal history of (healed) traumatic fracture: Secondary | ICD-10-CM | POA: Diagnosis not present

## 2023-10-23 DIAGNOSIS — M179 Osteoarthritis of knee, unspecified: Secondary | ICD-10-CM | POA: Diagnosis not present

## 2023-10-23 DIAGNOSIS — M47816 Spondylosis without myelopathy or radiculopathy, lumbar region: Secondary | ICD-10-CM | POA: Diagnosis not present

## 2023-10-23 DIAGNOSIS — M40204 Unspecified kyphosis, thoracic region: Secondary | ICD-10-CM | POA: Diagnosis not present

## 2023-10-23 DIAGNOSIS — Z79891 Long term (current) use of opiate analgesic: Secondary | ICD-10-CM | POA: Diagnosis not present

## 2023-10-23 DIAGNOSIS — G894 Chronic pain syndrome: Secondary | ICD-10-CM | POA: Diagnosis not present

## 2023-10-25 ENCOUNTER — Other Ambulatory Visit: Payer: Self-pay

## 2023-10-25 MED ORDER — FLUTICASONE PROPIONATE 50 MCG/ACT NA SUSP
1.0000 | Freq: Two times a day (BID) | NASAL | 0 refills | Status: DC
Start: 2023-10-25 — End: 2024-01-27

## 2023-10-25 NOTE — Progress Notes (Signed)
 Rx Refill

## 2023-10-27 ENCOUNTER — Other Ambulatory Visit: Payer: Self-pay | Admitting: Internal Medicine

## 2023-11-12 ENCOUNTER — Other Ambulatory Visit: Payer: Self-pay | Admitting: Internal Medicine

## 2023-11-15 ENCOUNTER — Other Ambulatory Visit: Payer: Self-pay | Admitting: Internal Medicine

## 2023-11-15 MED ORDER — QUVIVIQ 50 MG PO TABS: ORAL_TABLET | ORAL | 1 refills | Status: DC

## 2023-11-20 DIAGNOSIS — Z8781 Personal history of (healed) traumatic fracture: Secondary | ICD-10-CM | POA: Diagnosis not present

## 2023-11-20 DIAGNOSIS — M461 Sacroiliitis, not elsewhere classified: Secondary | ICD-10-CM | POA: Diagnosis not present

## 2023-11-20 DIAGNOSIS — M40204 Unspecified kyphosis, thoracic region: Secondary | ICD-10-CM | POA: Diagnosis not present

## 2023-11-20 DIAGNOSIS — M25531 Pain in right wrist: Secondary | ICD-10-CM | POA: Diagnosis not present

## 2023-11-20 DIAGNOSIS — M47816 Spondylosis without myelopathy or radiculopathy, lumbar region: Secondary | ICD-10-CM | POA: Diagnosis not present

## 2023-11-20 DIAGNOSIS — M51369 Other intervertebral disc degeneration, lumbar region without mention of lumbar back pain or lower extremity pain: Secondary | ICD-10-CM | POA: Diagnosis not present

## 2023-12-19 DIAGNOSIS — M5459 Other low back pain: Secondary | ICD-10-CM | POA: Diagnosis not present

## 2023-12-19 DIAGNOSIS — G8929 Other chronic pain: Secondary | ICD-10-CM | POA: Diagnosis not present

## 2023-12-23 ENCOUNTER — Ambulatory Visit: Admitting: Internal Medicine

## 2023-12-23 ENCOUNTER — Encounter: Payer: Self-pay | Admitting: Internal Medicine

## 2023-12-23 VITALS — BP 122/78 | HR 98 | Temp 98.4°F | Resp 18 | Ht 60.0 in | Wt 124.1 lb

## 2023-12-23 DIAGNOSIS — J0100 Acute maxillary sinusitis, unspecified: Secondary | ICD-10-CM | POA: Diagnosis not present

## 2023-12-23 DIAGNOSIS — I10 Essential (primary) hypertension: Secondary | ICD-10-CM

## 2023-12-23 DIAGNOSIS — G2581 Restless legs syndrome: Secondary | ICD-10-CM | POA: Insufficient documentation

## 2023-12-23 MED ORDER — AZITHROMYCIN 250 MG PO TABS: ORAL_TABLET | ORAL | 0 refills | Status: DC

## 2023-12-23 MED ORDER — ROPINIROLE HCL 0.5 MG PO TABS: ORAL_TABLET | ORAL | 3 refills | Status: DC

## 2023-12-23 NOTE — Assessment & Plan Note (Signed)
 Her BP is doing well.  We will continue no her current BP meds at this time.

## 2023-12-23 NOTE — Assessment & Plan Note (Signed)
 She will start a trial of zpak.  Continue nasal saline irrigation and flonase .

## 2023-12-23 NOTE — Assessment & Plan Note (Signed)
 She is on a good dose of gabapentin and she is on supplemental magnesium.  We will start her on requip .
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
## 2023-12-30 ENCOUNTER — Other Ambulatory Visit: Payer: Self-pay | Admitting: Internal Medicine

## 2024-01-01 DIAGNOSIS — M810 Age-related osteoporosis without current pathological fracture: Secondary | ICD-10-CM | POA: Diagnosis not present

## 2024-01-02 DIAGNOSIS — M810 Age-related osteoporosis without current pathological fracture: Secondary | ICD-10-CM | POA: Diagnosis not present

## 2024-01-16 DIAGNOSIS — M5459 Other low back pain: Secondary | ICD-10-CM | POA: Diagnosis not present

## 2024-01-16 DIAGNOSIS — G8929 Other chronic pain: Secondary | ICD-10-CM | POA: Diagnosis not present

## 2024-01-20 DIAGNOSIS — M1712 Unilateral primary osteoarthritis, left knee: Secondary | ICD-10-CM | POA: Diagnosis not present

## 2024-01-21 ENCOUNTER — Other Ambulatory Visit: Payer: Self-pay | Admitting: Internal Medicine

## 2024-01-21 DIAGNOSIS — G8929 Other chronic pain: Secondary | ICD-10-CM | POA: Diagnosis not present

## 2024-01-21 DIAGNOSIS — M25531 Pain in right wrist: Secondary | ICD-10-CM | POA: Diagnosis not present

## 2024-01-23 ENCOUNTER — Other Ambulatory Visit: Payer: Self-pay | Admitting: Internal Medicine

## 2024-01-28 DIAGNOSIS — Z1231 Encounter for screening mammogram for malignant neoplasm of breast: Secondary | ICD-10-CM | POA: Diagnosis not present

## 2024-01-29 DIAGNOSIS — Z1231 Encounter for screening mammogram for malignant neoplasm of breast: Secondary | ICD-10-CM | POA: Diagnosis not present

## 2024-01-30 ENCOUNTER — Other Ambulatory Visit: Payer: Self-pay

## 2024-01-30 MED ORDER — LORATADINE 10 MG PO TABS: 10.0000 mg | ORAL_TABLET | Freq: Every day | ORAL | 1 refills | Status: DC

## 2024-01-31 ENCOUNTER — Other Ambulatory Visit: Payer: Self-pay

## 2024-01-31 ENCOUNTER — Encounter: Payer: Self-pay | Admitting: Internal Medicine

## 2024-02-10 ENCOUNTER — Other Ambulatory Visit: Payer: Self-pay

## 2024-02-10 DIAGNOSIS — M818 Other osteoporosis without current pathological fracture: Secondary | ICD-10-CM | POA: Diagnosis not present

## 2024-02-10 DIAGNOSIS — M8589 Other specified disorders of bone density and structure, multiple sites: Secondary | ICD-10-CM | POA: Diagnosis not present

## 2024-02-10 DIAGNOSIS — Z79899 Other long term (current) drug therapy: Secondary | ICD-10-CM | POA: Diagnosis not present

## 2024-02-10 DIAGNOSIS — Z7189 Other specified counseling: Secondary | ICD-10-CM | POA: Diagnosis not present

## 2024-02-10 DIAGNOSIS — Z5181 Encounter for therapeutic drug level monitoring: Secondary | ICD-10-CM | POA: Diagnosis not present

## 2024-02-10 DIAGNOSIS — Z8781 Personal history of (healed) traumatic fracture: Secondary | ICD-10-CM | POA: Diagnosis not present

## 2024-02-10 MED ORDER — OMEPRAZOLE 40 MG PO CPDR: 40.0000 mg | DELAYED_RELEASE_CAPSULE | Freq: Two times a day (BID) | ORAL | 1 refills | Status: DC

## 2024-02-10 NOTE — Progress Notes (Signed)
 Refilled Omeprazole  40mg  to [facility] on [address]. In [place]

## 2024-02-13 DIAGNOSIS — M5459 Other low back pain: Secondary | ICD-10-CM | POA: Diagnosis not present

## 2024-02-27 ENCOUNTER — Other Ambulatory Visit: Payer: Self-pay

## 2024-02-27 MED ORDER — NURTEC 75 MG PO TBDP: 1.0000 | ORAL_TABLET | ORAL | 5 refills | Status: AC

## 2024-02-27 NOTE — Progress Notes (Signed)
 Refilled Nurtec to [facility] on [address]. In [place]

## 2024-03-02 ENCOUNTER — Encounter: Payer: Self-pay | Admitting: *Deleted

## 2024-03-02 NOTE — Progress Notes (Signed)
 RAJVI ARMENTOR                                          MRN: [id number]   03/02/2024   The [facility] Quality Team Specialist reviewed this patient medical record for the purposes of chart review for care gap closure. The following were reviewed: abstraction for care gap closure-breast cancer screening.    [facility] Quality Team

## 2024-03-09 ENCOUNTER — Other Ambulatory Visit: Payer: Self-pay | Admitting: Internal Medicine

## 2024-03-17 ENCOUNTER — Other Ambulatory Visit: Payer: Self-pay

## 2024-03-17 MED ORDER — QUVIVIQ 50 MG PO TABS: ORAL_TABLET | ORAL | 1 refills | Status: DC

## 2024-03-19 ENCOUNTER — Other Ambulatory Visit: Payer: Self-pay | Admitting: Internal Medicine

## 2024-04-01 ENCOUNTER — Ambulatory Visit: Admitting: Internal Medicine

## 2024-04-01 ENCOUNTER — Encounter: Payer: Self-pay | Admitting: Internal Medicine

## 2024-04-01 VITALS — BP 122/80 | HR 105 | Temp 98.4°F | Resp 18 | Ht <= 58 in | Wt 109.0 lb

## 2024-04-01 DIAGNOSIS — M48061 Spinal stenosis, lumbar region without neurogenic claudication: Secondary | ICD-10-CM

## 2024-04-01 DIAGNOSIS — G2581 Restless legs syndrome: Secondary | ICD-10-CM

## 2024-04-01 DIAGNOSIS — I10 Essential (primary) hypertension: Secondary | ICD-10-CM

## 2024-04-01 DIAGNOSIS — J302 Other seasonal allergic rhinitis: Secondary | ICD-10-CM

## 2024-04-01 DIAGNOSIS — E7800 Pure hypercholesterolemia, unspecified: Secondary | ICD-10-CM

## 2024-04-01 DIAGNOSIS — K219 Gastro-esophageal reflux disease without esophagitis: Secondary | ICD-10-CM

## 2024-04-01 DIAGNOSIS — K5909 Other constipation: Secondary | ICD-10-CM

## 2024-04-01 DIAGNOSIS — F32A Depression, unspecified: Secondary | ICD-10-CM

## 2024-04-01 DIAGNOSIS — M8000XG Age-related osteoporosis with current pathological fracture, unspecified site, subsequent encounter for fracture with delayed healing: Secondary | ICD-10-CM

## 2024-04-01 DIAGNOSIS — G43709 Chronic migraine without aura, not intractable, without status migrainosus: Secondary | ICD-10-CM

## 2024-04-01 DIAGNOSIS — R739 Hyperglycemia, unspecified: Secondary | ICD-10-CM

## 2024-04-01 DIAGNOSIS — G4700 Insomnia, unspecified: Secondary | ICD-10-CM

## 2024-04-01 DIAGNOSIS — D649 Anemia, unspecified: Secondary | ICD-10-CM

## 2024-04-01 DIAGNOSIS — R222 Localized swelling, mass and lump, trunk: Secondary | ICD-10-CM

## 2024-04-01 DIAGNOSIS — Z6823 Body mass index (BMI) 23.0-23.9, adult: Secondary | ICD-10-CM

## 2024-04-01 DIAGNOSIS — G894 Chronic pain syndrome: Secondary | ICD-10-CM

## 2024-04-01 DIAGNOSIS — E782 Mixed hyperlipidemia: Secondary | ICD-10-CM

## 2024-04-01 DIAGNOSIS — I700 Atherosclerosis of aorta: Secondary | ICD-10-CM

## 2024-04-01 DIAGNOSIS — S32000S Wedge compression fracture of unspecified lumbar vertebra, sequela: Secondary | ICD-10-CM

## 2024-04-01 MED ORDER — ROPINIROLE HCL 0.5 MG PO TABS: ORAL_TABLET | ORAL | 1 refills | Status: AC

## 2024-04-01 MED ORDER — LORATADINE 10 MG PO TABS: 10.0000 mg | ORAL_TABLET | Freq: Every day | ORAL | 1 refills | Status: AC

## 2024-04-01 MED ORDER — QUVIVIQ 50 MG PO TABS: ORAL_TABLET | ORAL | 1 refills | Status: DC

## 2024-04-01 MED ORDER — OXYBUTYNIN CHLORIDE ER 10 MG PO TB24: 10.0000 mg | ORAL_TABLET | Freq: Every day | ORAL | 2 refills | Status: AC

## 2024-04-01 MED ORDER — UBRELVY 100 MG PO TABS: ORAL_TABLET | ORAL | 2 refills | Status: AC

## 2024-04-01 MED ORDER — HYDROXYZINE HCL 50 MG PO TABS: 50.0000 mg | ORAL_TABLET | Freq: Every day | ORAL | 2 refills | Status: AC

## 2024-04-01 MED ORDER — OMEPRAZOLE 40 MG PO CPDR: 40.0000 mg | DELAYED_RELEASE_CAPSULE | Freq: Two times a day (BID) | ORAL | 1 refills | Status: AC

## 2024-04-01 MED ORDER — TOPIRAMATE 100 MG PO TABS: 150.0000 mg | ORAL_TABLET | Freq: Two times a day (BID) | ORAL | 2 refills | Status: AC

## 2024-04-01 MED ORDER — TRAZODONE HCL 50 MG PO TABS: 50.0000 mg | ORAL_TABLET | Freq: Every day | ORAL | 1 refills | Status: AC

## 2024-04-01 MED ORDER — LISINOPRIL 20 MG PO TABS: 20.0000 mg | ORAL_TABLET | Freq: Every day | ORAL | 3 refills | Status: AC

## 2024-04-01 MED ORDER — ONDANSETRON 4 MG PO TBDP: 4.0000 mg | ORAL_TABLET | Freq: Four times a day (QID) | ORAL | 0 refills | Status: AC | PRN

## 2024-04-01 MED ORDER — FLUTICASONE PROPIONATE 50 MCG/ACT NA SUSP: 1.0000 | Freq: Two times a day (BID) | NASAL | 3 refills | Status: AC
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
## 2024-04-01 NOTE — Addendum Note (Signed)
 Addended by: LENETTA LACKS on: 04/01/2024 12:01 PM   Modules accepted: Orders

## 2024-04-02 LAB — LIPID PANEL
Chol/HDL Ratio: 2.6 ratio (ref 0.0–4.4)
Cholesterol, Total: 336 mg/dL — ABNORMAL HIGH (ref 100–199)
HDL: 130 mg/dL (ref >39)
LDL Chol Calc (NIH): 191 mg/dL — ABNORMAL HIGH (ref 0–99)
Triglycerides: 96 mg/dL (ref 0–149)
VLDL Cholesterol Cal: 15 mg/dL (ref 5–40)

## 2024-04-02 LAB — CMP14 + ANION GAP
ALT: 11 IU/L (ref 0–32)
AST: 24 IU/L (ref 0–40)
Albumin: 4.6 g/dL (ref 3.8–4.9)
Alkaline Phosphatase: 83 IU/L (ref 49–135)
Anion Gap: 18 mmol/L (ref 10.0–18.0)
BUN/Creatinine Ratio: 12 (ref 9–23)
BUN: 10 mg/dL (ref 6–24)
Bilirubin Total: 0.2 mg/dL (ref 0.0–1.2)
CO2: 15 mmol/L — ABNORMAL LOW (ref 20–29)
Calcium: 9.7 mg/dL (ref 8.7–10.2)
Chloride: 97 mmol/L (ref 96–106)
Creatinine, Ser: 0.81 mg/dL (ref 0.57–1.00)
Globulin, Total: 1.9 g/dL (ref 1.5–4.5)
Glucose: 111 mg/dL — ABNORMAL HIGH (ref 70–99)
Potassium: 4.6 mmol/L (ref 3.5–5.2)
Sodium: 130 mmol/L — ABNORMAL LOW (ref 134–144)
Total Protein: 6.5 g/dL (ref 6.0–8.5)
eGFR: 84 mL/min/1.73 (ref >59)

## 2024-04-02 LAB — TSH: TSH: 1.4 u[IU]/mL (ref 0.450–4.500)

## 2024-04-07 ENCOUNTER — Other Ambulatory Visit: Payer: Self-pay | Admitting: Internal Medicine

## 2024-04-07 MED ORDER — QUVIVIQ 50 MG PO TABS: ORAL_TABLET | ORAL | 2 refills | Status: DC

## 2024-04-08 ENCOUNTER — Other Ambulatory Visit: Payer: Self-pay | Admitting: Internal Medicine

## 2024-04-08 MED ORDER — QUVIVIQ 50 MG PO TABS: ORAL_TABLET | ORAL | 2 refills | Status: AC

## 2024-04-14 ENCOUNTER — Ambulatory Visit: Payer: Self-pay

## 2024-04-14 NOTE — Progress Notes (Signed)
 Patient called.  Patient aware.  Per Dr. Fleeta Finger Her cholesterol is elevated- start nexletol 180mg  daily. .  Pt states she wants to research the medication and call us  back before we sent the medication to the Pharmacy.

## 2024-06-30 ENCOUNTER — Ambulatory Visit
